# Patient Record
Sex: Female | Born: 1960 | Race: Black or African American | Hispanic: No | Smoking: Never smoker
Health system: Southern US, Community
[De-identification: ages and names within clinical notes are randomized; demographics above are authoritative.]

## PROBLEM LIST (undated history)

## (undated) DIAGNOSIS — E119 Type 2 diabetes mellitus without complications: Secondary | ICD-10-CM

## (undated) HISTORY — PX: BREAST BIOPSY: SHX20

## (undated) HISTORY — PX: TUBAL LIGATION: SHX77

## (undated) HISTORY — PX: COLONOSCOPY: SHX174

---

## 2003-06-02 HISTORY — PX: GASTRIC BYPASS: SHX52

## 2012-05-02 LAB — HM COLONOSCOPY

## 2017-08-30 ENCOUNTER — Ambulatory Visit: Payer: Self-pay | Admitting: Endocrinology, Diabetes and Metabolism

## 2017-11-10 ENCOUNTER — Encounter: Payer: Self-pay | Admitting: Endocrinology, Diabetes and Metabolism

## 2017-11-10 ENCOUNTER — Ambulatory Visit: Payer: Self-pay | Admitting: Endocrinology, Diabetes and Metabolism

## 2017-11-10 ENCOUNTER — Ambulatory Visit (INDEPENDENT_AMBULATORY_CARE_PROVIDER_SITE_OTHER): Payer: 59 | Admitting: Endocrinology, Diabetes and Metabolism

## 2017-11-10 VITALS — BP 114/83 | HR 98 | Ht 67.0 in | Wt 258.4 lb

## 2017-11-10 DIAGNOSIS — E1165 Type 2 diabetes mellitus with hyperglycemia: Secondary | ICD-10-CM

## 2017-11-10 LAB — POCT GLUCOSE: Whole Blood Glucose POCT: 176 mg/dL — AB (ref 70–100)

## 2017-11-10 LAB — POCT HEMOGLOBIN A1C: POCT Hgb A1C: 8.2 % — AB (ref 3.9–5.9)

## 2017-11-10 MED ORDER — FREESTYLE LITE TEST VI STRP
ORAL_STRIP | 9 refills | Status: AC
Start: 2017-11-10 — End: 2018-11-10

## 2017-11-10 MED ORDER — GLIMEPIRIDE 4 MG PO TABS
4.00 mg | ORAL_TABLET | Freq: Two times a day (BID) | ORAL | 3 refills | Status: DC
Start: 2017-11-10 — End: 2018-02-04

## 2017-11-10 NOTE — Progress Notes (Signed)
Subjective:      Kristin West is a 57 y.o. female who presents for an initial evaluation of Type 2 diabetes mellitus.  Current symptoms/problems include hyperglycemia and have been unchanged. Symptoms have been present for several months.    The patient was initially diagnosed with Type 2 diabetes mellitus based on the following criteria:  DM diagnosed many years ago (ealry [phone])    Known diabetic complications: none  Cardiovascular risk factors: diabetes mellitus  Current diabetic medications include metformin and glipizide.     Eye exam current (within one year): yes  Weight trend: stable  Prior visit with dietician: yes  Current diet: in general, a "healthy" diet    Current exercise: walking    Current monitoring regimen: home blood tests - 1 times daily  Home blood sugar records: fasting range: 120 - 130  Any episodes of hypoglycemia? no    Is She on ACE inhibitor or angiotensin II receptor blocker?   No     The following portions of the patient's history were reviewed and updated as appropriate: allergies, current medications, past family history, past medical history, past social history, past surgical history and problem list.    No past medical history on file.  No past surgical history on file.  No family history on file.  No Known Allergies  Social History     Social History   . Marital status: Married     Spouse name: N/A   . Number of children: N/A   . Years of education: N/A     Occupational History   . Not on file.     Social History Main Topics   . Smoking status: Never Smoker   . Smokeless tobacco: Never Used   . Alcohol use Not on file   . Drug use: Unknown   . Sexual activity: Not on file     Other Topics Concern   . Not on file     Social History Narrative   . No narrative on file     No current outpatient prescriptions on file prior to visit.     No current facility-administered medications on file prior to visit.      Vitals:    11/10/17 1248   BP: 114/83   Pulse: 98   SpO2: 98%     Lab  Results   Component Value Date    HGBA1C 8.2 (A) 11/10/2017     Lab Results   Component Value Date    HGBA1C 8.2 (A) 11/10/2017       Review of Systems    Constitutional: negative  Eyes: negative  Respiratory: negative  Cardiovascular: negative  Gastrointestinal: negative  Musculoskeletal: negative   Neurological: negative  Behavioral: negative  Skin: negative  Hematology: negative     Objective:      BP 114/83 (BP Site: Right arm, Patient Position: Sitting, Cuff Size: Large)   Pulse 98   Ht 1.702 m (5\' 7" )   Wt 117.2 kg (258 lb 6.4 oz)   SpO2 98%   BMI 40.47 kg/m      Constitutional: Oriented to person, place, and time and well-developed, well-nourished, and in no distress.   Head: Normocephalic and atraumatic.   Eyes: Conjunctivae and EOM are normal. Pupils are equal, round, and reactive to light.   Neck: Normal thyroid exam.  Cardiac: Normal auscultation.  Pulmonary: Normal auscultation.  Extremities: No cyanosis, clubbing or edema.  Musculoskeletal: Normal range of motion.   Neurological: Alert and oriented  to person, place, and time. Feet are in good condition with no ulcers or deformities and good response to light touch.  Psychiatric: Affect and judgment normal.    Lab Review  POC 172  POC A1C 8.2     Assessment:     Type 2 DM, uncontrolled     Plan:       Discussed with patient the importance of achieving and maintaining targeted glycemic control in order to reduce the risk of diabetes related complications such as eye disease, kidney disease, nerve damage, and amputation of the limbs.  Discussed the importance of patient's active involvement in the diabetes care (attending diabetes education classes or individual education sessions, monitoring blood glucose levels before and after meals and keeping a log of self monitored blood glucose, SMBG, taking prescribed medications on time, eating a healthy diabetic diet, and engaging in regular physical activity as tolerated).   Discussed the importance of  addressing and managing traditional modifiable cardiac risk factors (lipids, blood pressure, smoking, obesity, sedentary lifestyle) in order to reduce the risk of cardiovascular disease, CVD (heart attacks, heart failure, strokes, and peripheral arterial disease).   Discussed the importance of recognizing, treating, and preventing hypoglycemia.  Discussed the importance of having regular eye exams and foot care.    1.  Rx changes: Get baseline labs. We discussed options such as glp-1 agonists, SGLT-2 inhibitors, etc..  2.  Education: Reviewed 'ABCs' of diabetes management (respective goals in parentheses):  A1C (<7), blood pressure (<130/80), and cholesterol (LDL <100). Follow up on BP with PCP.  3.  Compliance at present is estimated to be good.   4.  Follow up: 3 months.    Elijio Miles.H.Tamera Punt, MD  [facility]  [address]  [place], [place] [address]  Tel: [phone]  Fax: [phone]    [address]  [place], [place] [address]  Tel: [phone]              Fax: [phone]

## 2017-12-10 ENCOUNTER — Encounter: Payer: Self-pay | Admitting: Endocrinology, Diabetes and Metabolism

## 2017-12-10 DIAGNOSIS — E1165 Type 2 diabetes mellitus with hyperglycemia: Secondary | ICD-10-CM | POA: Insufficient documentation

## 2018-01-24 ENCOUNTER — Other Ambulatory Visit: Payer: Self-pay | Admitting: Endocrinology, Diabetes and Metabolism

## 2018-01-24 MED ORDER — METFORMIN HCL ER (MOD) 1000 MG PO TB24
1000.00 mg | ORAL_TABLET | Freq: Two times a day (BID) | ORAL | 3 refills | Status: DC
Start: 2018-01-24 — End: 2018-08-28

## 2018-01-27 NOTE — Progress Notes (Unsigned)
On 01-24-18 , refilled metformin. GH

## 2018-02-04 ENCOUNTER — Other Ambulatory Visit: Payer: Self-pay | Admitting: Endocrinology, Diabetes and Metabolism

## 2018-02-04 ENCOUNTER — Other Ambulatory Visit: Payer: Self-pay

## 2018-02-04 MED ORDER — GLIPIZIDE 10 MG PO TABS
10.00 mg | ORAL_TABLET | Freq: Two times a day (BID) | ORAL | 3 refills | Status: DC
Start: 2018-02-04 — End: 2019-01-12

## 2018-03-16 ENCOUNTER — Ambulatory Visit: Payer: 59 | Admitting: Endocrinology, Diabetes and Metabolism

## 2018-04-18 ENCOUNTER — Ambulatory Visit: Payer: 59 | Admitting: Endocrinology, Diabetes and Metabolism

## 2018-05-24 ENCOUNTER — Telehealth: Payer: Self-pay | Admitting: Endocrinology, Diabetes and Metabolism

## 2018-05-24 NOTE — Telephone Encounter (Signed)
-----   Message from Mackie Pai sent at 05/24/2018  8:10 AM EST -----  Regarding: Lab Orders  Good Morning Jill Alexanders,  Patient Thalia Baldonado called and reschedule her appt from 05/26/18 to 07/21/18.  She has lost her lab orders and would like you to resend them to her.  She is requesting a call from you today if possible.  She can be reached at [phone].    Thanks,  Elease Hashimoto

## 2018-05-24 NOTE — Telephone Encounter (Signed)
Mailed lab orders to patient's home address on file.

## 2018-05-26 ENCOUNTER — Ambulatory Visit: Payer: 59 | Admitting: Endocrinology, Diabetes and Metabolism

## 2018-06-22 ENCOUNTER — Telehealth: Payer: Self-pay | Admitting: Endocrinology, Diabetes and Metabolism

## 2018-06-22 ENCOUNTER — Other Ambulatory Visit (HOSPITAL_BASED_OUTPATIENT_CLINIC_OR_DEPARTMENT_OTHER): Payer: Self-pay | Admitting: Endocrinology, Diabetes and Metabolism

## 2018-06-22 DIAGNOSIS — E1165 Type 2 diabetes mellitus with hyperglycemia: Secondary | ICD-10-CM

## 2018-06-22 NOTE — Telephone Encounter (Signed)
Mailed lab orders to patient's home address on file. Notified her to fast before lab work. Patient verbalized understanding.

## 2018-06-22 NOTE — Telephone Encounter (Signed)
-----   Message from Jaclyn Shaggy, MD sent at 06/22/2018  2:44 PM EST -----  Regarding: RE: Lab order  We can mail out  ----- Message -----  From: Randa Ngo II  Sent: 06/22/2018   2:28 PM EST  To: Jaclyn Shaggy, MD  Subject: Lab order                                        Dr. Tamera Punt,    Patient has a follow up visit on 07/21/18. Patient would like to get lab work done before her appointment. I can mail if appropriate.     Thank you.   ----- Message -----  From: Marta Lamas  Sent: 06/22/2018   1:05 PM EST  To: Randa Ngo II  Subject: lab order                                        Pt says she is waiting for lab order. She said if you can please mail this to her, she wants to get it done before her apt. She said she has not received it since her last request. Please give her a call when this is done

## 2018-07-09 LAB — LIPID PANEL
Cholesterol / HDL Ratio: 1.7 ratio (ref 0.0–4.4)
Cholesterol: 176 mg/dL (ref 100–199)
HDL: 106 mg/dL (ref >39)
LDL Calculated: 50 mg/dL (ref 0–99)
Triglycerides: 99 mg/dL (ref 0–149)
VLDL Calculated: 20 mg/dL (ref 5–40)

## 2018-07-09 LAB — COMPREHENSIVE METABOLIC PANEL
ALT: 19 IU/L (ref 0–32)
AST (SGOT): 22 IU/L (ref 0–40)
Albumin/Globulin Ratio: 1.4 (ref 1.2–2.2)
Albumin: 4.3 g/dL (ref 3.8–4.9)
Alkaline Phosphatase: 92 IU/L (ref 39–117)
BUN / Creatinine Ratio: 21 (ref 9–23)
BUN: 17 mg/dL (ref 6–24)
Bilirubin, Total: 0.6 mg/dL (ref 0.0–1.2)
CO2: 20 mmol/L (ref 20–29)
Calcium: 9.7 mg/dL (ref 8.7–10.2)
Chloride: 98 mmol/L (ref 96–106)
Creatinine: 0.82 mg/dL (ref 0.57–1.00)
EGFR: 79 mL/min/{1.73_m2} (ref >59)
EGFR: 91 mL/min/{1.73_m2} (ref >59)
Globulin, Total: 3.1 g/dL (ref 1.5–4.5)
Glucose: 213 mg/dL — ABNORMAL HIGH (ref 65–99)
Potassium: 4.5 mmol/L (ref 3.5–5.2)
Protein, Total: 7.4 g/dL (ref 6.0–8.5)
Sodium: 136 mmol/L (ref 134–144)

## 2018-07-09 LAB — MICROALBUMIN, RANDOM URINE
Creatinine, UR: 14.8 mg/dL
Microalb/Crt. Ratio: <20 mg/g creat (ref 0–29)
Microalbumin, UR: <3 ug/mL

## 2018-07-21 ENCOUNTER — Ambulatory Visit (INDEPENDENT_AMBULATORY_CARE_PROVIDER_SITE_OTHER): Payer: 59 | Admitting: Endocrinology, Diabetes and Metabolism

## 2018-07-21 ENCOUNTER — Encounter: Payer: Self-pay | Admitting: Endocrinology, Diabetes and Metabolism

## 2018-07-21 VITALS — BP 132/83 | HR 90

## 2018-07-21 DIAGNOSIS — E1165 Type 2 diabetes mellitus with hyperglycemia: Secondary | ICD-10-CM

## 2018-07-21 MED ORDER — FREESTYLE FREEDOM LITE W/DEVICE KIT
PACK | 1 refills | Status: AC
Start: 2018-07-21 — End: 2019-07-21

## 2018-07-21 MED ORDER — DULAGLUTIDE 0.75 MG/0.5ML SC SOPN
0.75 mg | PEN_INJECTOR | SUBCUTANEOUS | 0 refills | Status: DC
Start: 2018-07-21 — End: 2018-08-15

## 2018-07-21 NOTE — Progress Notes (Signed)
Subjective:      Kristin West is a 58 y.o. female who presents for an initial evaluation of Type 2 diabetes mellitus.  Her last visit was back in June 2019. We reviewed her recent labs.    Current symptoms/problems include hyperglycemia and have been unchanged. Symptoms have been present for several months.    The patient was initially diagnosed with Type 2 diabetes mellitus based on the following criteria:  DM diagnosed many years ago (ealry [phone]). Tried Victoza and Byetta. The insurance then stopped paying for them    Known diabetic complications: none  Cardiovascular risk factors: diabetes mellitus  Current diabetic medications include metformin and glipizide.     Eye exam current (within one year): yes  Weight trend: stable  Prior visit with dietician: yes  Current diet: in general, a "healthy" diet    Current exercise: walking    Current monitoring regimen: home blood tests - 1 times daily  Home blood sugar records: mid 100 mg/dL  Any episodes of hypoglycemia? no    Is She on ACE inhibitor or angiotensin II receptor blocker?   No     The following portions of the patient's history were reviewed and updated as appropriate: allergies, current medications, past family history, past medical history, past social history, past surgical history and problem list.    No past medical history on file.  No past surgical history on file.  No family history on file.  No Known Allergies  Social History     Socioeconomic History   . Marital status: Married     Spouse name: Not on file   . Number of children: Not on file   . Years of education: Not on file   . Highest education level: Not on file   Occupational History   . Not on file   Social Needs   . Financial resource strain: Not on file   . Food insecurity:     Worry: Not on file     Inability: Not on file   . Transportation needs:     Medical: Not on file     Non-medical: Not on file   Tobacco Use   . Smoking status: Never Smoker   . Smokeless tobacco: Never Used    Substance and Sexual Activity   . Alcohol use: Not on file   . Drug use: Not on file   . Sexual activity: Not on file   Lifestyle   . Physical activity:     Days per week: Not on file     Minutes per session: Not on file   . Stress: Not on file   Relationships   . Social connections:     Talks on phone: Not on file     Gets together: Not on file     Attends religious service: Not on file     Active member of club or organization: Not on file     Attends meetings of clubs or organizations: Not on file     Relationship status: Not on file   . Intimate partner violence:     Fear of current or ex partner: Not on file     Emotionally abused: Not on file     Physically abused: Not on file     Forced sexual activity: Not on file   Other Topics Concern   . Not on file   Social History Narrative   . Not on file     Current Outpatient  Medications on File Prior to Visit   Medication Sig Dispense Refill   . diclofenac (VOLTAREN) 75 MG EC tablet Take 75 mg by mouth daily  0   . FREESTYLE LITE test strip Use twice daily 100 each 9   . glipiZIDE (GLUCOTROL) 10 MG tablet Take 1 tablet (10 mg total) by mouth 2 (two) times daily before meals 180 tablet 3   . metFORMIN (GLUMETZA) 1000 MG (MOD) 24 hr tablet Take 1 tablet (1,000 mg total) by mouth 2 (two) times daily 180 tablet 3     No current facility-administered medications on file prior to visit.      Vitals:    07/21/18 1308   BP: 132/83   Pulse: 90   SpO2: 98%     Lab Results   Component Value Date    HGBA1C 8.2 (A) 11/10/2017     Lab Results   Component Value Date    CHOL 176 07/08/2018    TRIG 99 07/08/2018    HDL 106 07/08/2018    LDL 50 07/08/2018    ALT 19 07/08/2018    AST 22 07/08/2018    NA 136 07/08/2018    K 4.5 07/08/2018    CL 98 07/08/2018    CREAT 0.82 07/08/2018    BUN 17 07/08/2018    CO2 20 07/08/2018    GLU 213 (H) 07/08/2018    HGBA1C 8.2 (A) 11/10/2017       Review of Systems    Constitutional: negative  Eyes: negative  Respiratory: negative  Cardiovascular:  negative  Gastrointestinal: negative  Musculoskeletal: negative   Neurological: negative  Behavioral: negative  Skin: negative  Hematology: negative     Objective:      BP 132/83   Pulse 90   SpO2 98%      Constitutional: Oriented to person, place, and time and well-developed, well-nourished, and in no distress.   Head: Normocephalic and atraumatic.   Eyes: Conjunctivae and EOM are normal. Pupils are equal, round, and reactive to light.   Neurological: Alert and oriented to person, place, and time.  Psychiatric: Affect and judgment normal.    Lab Review  POC 249  POC A1C 8.6     Assessment:     Type 2 DM, uncontrolled     Plan:       1.  Rx changes: Will start Trulicty 0.75 mg weekly and increase to 1.5 mg weekly after that if well tolerated. We discussed the side effects with te glp-1 agonists and I showed her how to do the trulicity injection.  2.  Education: Reviewed 'ABCs' of diabetes management (respective goals in parentheses):  A1C (<7), blood pressure (<130/80), and cholesterol (LDL <100). Follow up on BP with PCP.  3.  Compliance at present is estimated to be fair.  4.  Follow up: 3 months.    Elijio Miles.H.Tamera Punt, MD  [facility]  [address]  [place], [place] [address]  Tel: [phone]  Fax: [phone]    [address]  [place], [place] [address]  Tel: [phone]              Fax: [phone]

## 2018-07-29 ENCOUNTER — Other Ambulatory Visit (HOSPITAL_BASED_OUTPATIENT_CLINIC_OR_DEPARTMENT_OTHER): Payer: Self-pay | Admitting: Endocrinology, Diabetes and Metabolism

## 2018-07-29 DIAGNOSIS — E1165 Type 2 diabetes mellitus with hyperglycemia: Secondary | ICD-10-CM

## 2018-07-29 MED ORDER — ACCU-CHEK AVIVA PLUS W/DEVICE KIT
1.00 | PACK | Freq: Every day | 0 refills | Status: AC
Start: 2018-07-29 — End: 2019-07-29

## 2018-07-29 MED ORDER — ACCU-CHEK AVIVA VI STRP
ORAL_STRIP | 5 refills | Status: DC
Start: 2018-07-29 — End: 2018-08-03

## 2018-08-03 ENCOUNTER — Other Ambulatory Visit (HOSPITAL_BASED_OUTPATIENT_CLINIC_OR_DEPARTMENT_OTHER): Payer: Self-pay | Admitting: Endocrinology, Diabetes and Metabolism

## 2018-08-03 MED ORDER — GLUCOSE BLOOD VI STRP
ORAL_STRIP | 5 refills | Status: AC
Start: 2018-08-03 — End: ?

## 2018-08-04 ENCOUNTER — Telehealth: Payer: Self-pay | Admitting: Endocrinology, Diabetes and Metabolism

## 2018-08-04 NOTE — Telephone Encounter (Signed)
Rx sent to pharmacy on file. Rx receipt confirmed by pharmacy.

## 2018-08-04 NOTE — Telephone Encounter (Signed)
-----   Message from Jaclyn Shaggy, MD sent at 08/03/2018  4:15 PM EST -----  Regarding: RE: Rx request  Done  ----- Message -----  From: Randa Ngo II  Sent: 08/03/2018   3:54 PM EST  To: Jaclyn Shaggy, MD  Subject: Rx request                                       Dr. Tamera Punt,    [facility] is requesting Accu-chek Guide test strips for a 90 day supply.     Thank you.

## 2018-08-15 ENCOUNTER — Other Ambulatory Visit: Payer: Self-pay | Admitting: Endocrinology, Diabetes and Metabolism

## 2018-08-15 MED ORDER — DULAGLUTIDE 1.5 MG/0.5ML SC SOPN
1.50 mg | PEN_INJECTOR | SUBCUTANEOUS | 5 refills | Status: DC
Start: 2018-08-15 — End: 2019-07-24

## 2018-08-16 ENCOUNTER — Telehealth: Payer: Self-pay

## 2018-08-16 NOTE — Telephone Encounter (Addendum)
Kristin West was notified that Dr. Tamera Punt sent a new rx for Trulicity 1.5 mg pen to [facility]. The administration is the same. Patient asked to contact the office should she have any problems with the higher dose of Trulicity. [name] verbalized her understanding. 2:11 pm..        ----- Message from Jaclyn Shaggy, MD sent at 08/15/2018  3:51 PM EDT -----  Regarding: RE: Trulicity rx  Contact: [phone]  I changed her dose to 1.5 mg weekly. She can start with it and let us know if she has side effects   ----- Message -----  From: Pecola Lawless, LPN  Sent: 0/98/1191  12:00 PM EDT  To: Jaclyn Shaggy, MD  Subject: Trulicity rx                                     [name] left a message stating that she took (administered) her last Trulicity injection today. Next step? She didn't indicate any issues with the lower dose. I didn't see a rx for the 1.5 mg dose.

## 2018-08-27 ENCOUNTER — Other Ambulatory Visit: Payer: Self-pay | Admitting: Endocrinology, Diabetes and Metabolism

## 2018-09-07 DIAGNOSIS — R079 Chest pain, unspecified: Secondary | ICD-10-CM | POA: Insufficient documentation

## 2018-10-20 ENCOUNTER — Encounter: Payer: Self-pay | Admitting: Endocrinology, Diabetes and Metabolism

## 2018-10-20 ENCOUNTER — Telehealth (INDEPENDENT_AMBULATORY_CARE_PROVIDER_SITE_OTHER): Payer: 59 | Admitting: Endocrinology, Diabetes and Metabolism

## 2018-10-20 ENCOUNTER — Telehealth: Payer: Self-pay | Admitting: Endocrinology, Diabetes and Metabolism

## 2018-10-20 DIAGNOSIS — E559 Vitamin D deficiency, unspecified: Secondary | ICD-10-CM

## 2018-10-20 DIAGNOSIS — E1165 Type 2 diabetes mellitus with hyperglycemia: Secondary | ICD-10-CM

## 2018-10-20 NOTE — Telephone Encounter (Signed)
-----   Message from Marta Lamas sent at 10/20/2018 11:19 AM EDT -----  Regarding: lab order  Pt states she never received lab order

## 2018-10-20 NOTE — Progress Notes (Signed)
[facility]   EndocrinologyTelemedicine Follow-up  via HIPAA compliant secure video link   Date 10/20/2018 (Start time 11 am - End Time 11:20 am)    Originating site (Patient location): Home   Distant site (Provider location): [facility]  Provider and Title:  Vela Prose, MD  Consent obtained: Yes (verbal)   Language: Kristin West is a 58 y.o. female who presents for an initial evaluation of Type 2 diabetes mellitus.  Her last visit was back in February 2020. Also has a H/O vitamin D deficiency.     Current symptoms/problems include hyperglycemia and have been unchanged. Symptoms have been present for several months.    The patient was initially diagnosed with Type 2 diabetes mellitus based on the following criteria:  DM diagnosed many years ago (ealry [phone]). Tried Victoza and Byetta. The insurance then stopped paying for them. On Trulicity now.    Known diabetic complications: none  Cardiovascular risk factors: diabetes mellitus  Current diabetic medications include trulicity, metformin and glipizide     Eye exam current (within one year): yes  Weight trend: stable  Prior visit with dietician: yes  Current diet: in general, a "healthy" diet    Current exercise: walking    Current monitoring regimen: home blood tests - 1 times daily  Home blood sugar records: low to mid 100 mg/dL overall   Any episodes of hypoglycemia? no    Is She on ACE inhibitor or angiotensin II receptor blocker?   No     The following portions of the patient's history were reviewed and updated as appropriate: allergies, current medications, past family history, past medical history, past social history, past surgical history and problem list.    No past medical history on file.  No past surgical history on file.  No family history on file.  No Known Allergies  Social History     Socioeconomic History   . Marital status: Married     Spouse name: Not on  file   . Number of children: Not on file   . Years of education: Not on file   . Highest education level: Not on file   Occupational History   . Not on file   Social Needs   . Financial resource strain: Not on file   . Food insecurity:     Worry: Not on file     Inability: Not on file   . Transportation needs:     Medical: Not on file     Non-medical: Not on file   Tobacco Use   . Smoking status: Never Smoker   . Smokeless tobacco: Never Used   Substance and Sexual Activity   . Alcohol use: Not on file   . Drug use: Not on file   . Sexual activity: Not on file   Lifestyle   . Physical activity:     Days per week: Not on file     Minutes per session: Not on file   . Stress: Not on file   Relationships   . Social connections:     Talks on phone: Not on file     Gets together: Not on file     Attends religious service: Not on file     Active member of club or organization: Not on file     Attends meetings of clubs or organizations: Not on file     Relationship  status: Not on file   . Intimate partner violence:     Fear of current or ex partner: Not on file     Emotionally abused: Not on file     Physically abused: Not on file     Forced sexual activity: Not on file   Other Topics Concern   . Not on file   Social History Narrative   . Not on file     Current Outpatient Medications on File Prior to Visit   Medication Sig Dispense Refill   . Blood Glucose Monitoring Suppl (ACCU-CHEK AVIVA PLUS) w/Device Kit 1 Device by Does not apply route daily 1 kit 0   . Blood Glucose Monitoring Suppl (FREESTYLE FREEDOM LITE) w/Device Kit Use daily 1 each 1   . diclofenac (VOLTAREN) 75 MG EC tablet Take 75 mg by mouth daily  0   . Dulaglutide 1.5 MG/0.5ML Solution Pen-injector Inject 1.5 mg into the skin once a week 4 pen 5   . FREESTYLE LITE test strip Use twice daily 100 each 9   . glipiZIDE (GLUCOTROL) 10 MG tablet Take 1 tablet (10 mg total) by mouth 2 (two) times daily before meals 180 tablet 3   . glucose blood (ACCU-CHEK GUIDE)  test strip Use twice daily 100 each 5   . metFORMIN (GLUMETZA) 1000 MG (MOD) 24 hr tablet TAKE 1 TABLET BY MOUTH TWICE A DAY 60 tablet 5     No current facility-administered medications on file prior to visit.      There were no vitals filed for this visit.  Lab Results   Component Value Date    HGBA1C 8.2 (A) 11/10/2017     Lab Results   Component Value Date    CHOL 176 07/08/2018    TRIG 99 07/08/2018    HDL 106 07/08/2018    LDL 50 07/08/2018    ALT 19 07/08/2018    AST 22 07/08/2018    NA 136 07/08/2018    K 4.5 07/08/2018    CL 98 07/08/2018    CREAT 0.82 07/08/2018    BUN 17 07/08/2018    CO2 20 07/08/2018    GLU 213 (H) 07/08/2018    HGBA1C 8.2 (A) 11/10/2017       Review of Systems    Constitutional: negative  Eyes: negative  Respiratory: negative  Cardiovascular: negative  Gastrointestinal: negative  Musculoskeletal: negative   Neurological: negative  Behavioral: negative  Skin: negative  Hematology: negative     Objective:      There were no vitals taken for this visit.     Constitutional: Oriented to person, place, and time and well-developed, well-nourished, and in no distress.   Head: Normocephalic and atraumatic.   Eyes: Conjunctivae and EOM are normal.      Assessment:     Type 2 DM  Vitamin D deficiency      Plan:       1.  Rx changes: Continue with current pharmacotherapy for now with emphasis on prevention of hypoglycemia.    2.  Get labs (A1C, lipids, Vita D, CMP, TSH and urine microalbumin).  3.  Education: Reviewed 'ABCs' of diabetes management (respective goals in parentheses):  A1C (<7), blood pressure (<130/80), and cholesterol (LDL <100). Follow up on BP with PCP.  4.  Compliance at present is estimated to be fair.  5.  Follow up: 3 months.    Elijio Miles.H.Tamera Punt, MD  [facility]  [address]  [place], [place] [address]  Tel: [phone]  Fax: [phone]    [address]  [place], [place] [address]  Tel: [phone]              Fax:  [phone]

## 2018-10-20 NOTE — Telephone Encounter (Signed)
Faxed lab orders to [phone]. Fax confirmation received.

## 2018-11-07 ENCOUNTER — Other Ambulatory Visit (HOSPITAL_BASED_OUTPATIENT_CLINIC_OR_DEPARTMENT_OTHER): Payer: Self-pay | Admitting: Endocrinology, Diabetes and Metabolism

## 2018-11-07 LAB — LIPID PANEL
Cholesterol: 153 (ref 0–200)
HDL: 102 — AB (ref 35–70)
LDL Cholesterol: 34
Triglycerides: 86 (ref 40–160)

## 2018-11-07 LAB — MICROALBUMIN, URINE: Microalb, Ur: 11.6

## 2018-11-07 LAB — TSH: TSH: 2.13 (ref 0.41–5.90)

## 2018-11-08 LAB — COMPREHENSIVE METABOLIC PANEL
ALT: 17 IU/L (ref 0–32)
AST (SGOT): 25 IU/L (ref 0–40)
Albumin/Globulin Ratio: 1.2 (ref 1.2–2.2)
Albumin: 3.8 g/dL (ref 3.8–4.9)
Alkaline Phosphatase: 84 IU/L (ref 39–117)
BUN / Creatinine Ratio: 15 (ref 9–23)
BUN: 13 mg/dL (ref 6–24)
Bilirubin, Total: 0.5 mg/dL (ref 0.0–1.2)
CO2: 22 mmol/L (ref 20–29)
Calcium: 9.4 mg/dL (ref 8.7–10.2)
Chloride: 99 mmol/L (ref 96–106)
Creatinine: 0.87 mg/dL (ref 0.57–1.00)
EGFR: 74 mL/min/{1.73_m2} (ref >59)
EGFR: 85 mL/min/{1.73_m2} (ref >59)
Globulin, Total: 3.2 g/dL (ref 1.5–4.5)
Glucose: 195 mg/dL — ABNORMAL HIGH (ref 65–99)
Potassium: 4.3 mmol/L (ref 3.5–5.2)
Protein, Total: 7 g/dL (ref 6.0–8.5)
Sodium: 134 mmol/L (ref 134–144)

## 2018-11-08 LAB — LIPID PANEL, WITHOUT TOTAL CHOLESTEROL/HDL RATIO, SERUM
Cholesterol: 153 mg/dL (ref 100–199)
HDL: 102 mg/dL (ref >39)
LDL Calculated: 34 mg/dL (ref 0–99)
Triglycerides: 86 mg/dL (ref 0–149)
VLDL Calculated: 17 mg/dL (ref 5–40)

## 2018-11-08 LAB — VITAMIN D,25 OH,TOTAL: Vitamin D 25-Hydroxy: 15.5 ng/mL — ABNORMAL LOW (ref 30.0–100.0)

## 2018-11-08 LAB — MICROALBUMIN, RANDOM URINE
Creatinine, UR: 136.2 mg/dL
Microalb/Crt. Ratio: 9 mg/g creat (ref 0–29)
Microalbumin, UR: 11.6 ug/mL

## 2018-11-08 LAB — HEMOGLOBIN A1C: Hemoglobin A1C: 7.3 % — ABNORMAL HIGH (ref 4.8–5.6)

## 2018-11-08 LAB — TSH: TSH: 2.13 u[IU]/mL (ref 0.450–4.500)

## 2018-11-28 ENCOUNTER — Telehealth: Payer: Self-pay | Admitting: Endocrinology, Diabetes and Metabolism

## 2018-11-28 NOTE — Telephone Encounter (Signed)
Spoke to patient, updated her on lab results and mailed out a copy of lab results to patient's new address on file.     [address] [place], [place] [address].

## 2018-11-28 NOTE — Telephone Encounter (Signed)
-----   Message from Jaclyn Shaggy, MD sent at 11/28/2018  1:27 PM EDT -----  Regarding: RE: Lab results  Contact: [phone]  Ure,  Her A1C has gotten significantly better. Everything else is good except the vitamin D is low at 15.  She needs to take 2000 units daily (over the counter)  Thanks  ----- Message -----  From: Randa Ngo II  Sent: 11/28/2018  11:21 AM EDT  To: Jaclyn Shaggy, MD  Subject: Lab results                                      Dr. Tamera Punt,    Patient is calling for an update to her lab results from 11/07/18. Please let me know and I can update the patient.     Thank you.   ----- Message -----  From: Pablo Ledger, LPN  Sent: 0/98/1191  11:16 AM EDT  To: Randa Ngo II  Subject: Lab Results                                      Jill Alexanders,    Patient is calling to inquire about lab results from 11/05/2018. He has not heard back. Please call with results at [phone]. Thanks!

## 2019-01-12 ENCOUNTER — Other Ambulatory Visit: Payer: Self-pay | Admitting: Endocrinology, Diabetes and Metabolism

## 2019-02-08 DIAGNOSIS — E559 Vitamin D deficiency, unspecified: Secondary | ICD-10-CM | POA: Insufficient documentation

## 2019-04-03 ENCOUNTER — Encounter: Payer: Self-pay | Admitting: Endocrinology, Diabetes and Metabolism

## 2019-04-03 ENCOUNTER — Other Ambulatory Visit: Payer: Self-pay | Admitting: Endocrinology, Diabetes and Metabolism

## 2019-04-03 DIAGNOSIS — E559 Vitamin D deficiency, unspecified: Secondary | ICD-10-CM

## 2019-04-03 DIAGNOSIS — E1165 Type 2 diabetes mellitus with hyperglycemia: Secondary | ICD-10-CM

## 2019-04-03 NOTE — Progress Notes (Signed)
Mailed printed lab requisition (cmp, a1c, vit d 25 oh total) to patient address on file per request. 3:47 pm.

## 2019-05-22 ENCOUNTER — Other Ambulatory Visit: Payer: Self-pay | Admitting: Nurse Practitioner

## 2019-05-22 DIAGNOSIS — Z1231 Encounter for screening mammogram for malignant neoplasm of breast: Secondary | ICD-10-CM

## 2019-05-23 ENCOUNTER — Other Ambulatory Visit: Payer: Self-pay

## 2019-05-23 ENCOUNTER — Ambulatory Visit
Admission: RE | Admit: 2019-05-23 | Discharge: 2019-05-23 | Disposition: A | Discharge status: Home / Self Care | Payer: No Typology Code available for payment source | Source: Ambulatory Visit | Attending: Nurse Practitioner | Admitting: Nurse Practitioner

## 2019-05-23 DIAGNOSIS — Z1231 Encounter for screening mammogram for malignant neoplasm of breast: Secondary | ICD-10-CM

## 2019-05-23 LAB — BASIC METABOLIC PANEL
BUN: 18 (ref 4–21)
CO2: 21 (ref 13–22)
Chloride: 99 (ref 99–108)
Creatinine: 0.9 (ref 0.5–1.1)
Glucose: 154
Potassium: 4.4 (ref 3.4–5.3)
Sodium: 138 (ref 137–147)

## 2019-05-23 LAB — HEPATIC FUNCTION PANEL
ALT: 85 — AB (ref 7–35)
AST: 115 — AB (ref 13–35)
Alkaline Phosphatase: 85 (ref 25–125)
Bilirubin, Total: 0.4

## 2019-05-23 LAB — COMPREHENSIVE METABOLIC PANEL
Albumin: 4.1 (ref 3.5–5.0)
Calcium: 9.7 (ref 8.7–10.7)
GFR calc Af Amer: 86
GFR calc non Af Amer: 74
Globulin: 2.8

## 2019-05-23 LAB — VITAMIN D 25 HYDROXY (VIT D DEFICIENCY, FRACTURES): Vit D, 25-Hydroxy: 16.3

## 2019-05-23 LAB — HEMOGLOBIN A1C: Hemoglobin A1C: 7.7

## 2019-05-24 LAB — COMPREHENSIVE METABOLIC PANEL
ALT: 85 IU/L — ABNORMAL HIGH (ref 0–32)
AST (SGOT): 115 IU/L — ABNORMAL HIGH (ref 0–40)
Albumin/Globulin Ratio: 1.5 (ref 1.2–2.2)
Albumin: 4.1 g/dL (ref 3.8–4.9)
Alkaline Phosphatase: 85 IU/L (ref 39–117)
BUN / Creatinine Ratio: 21 (ref 9–23)
BUN: 18 mg/dL (ref 6–24)
Bilirubin, Total: 0.4 mg/dL (ref 0.0–1.2)
CO2: 21 mmol/L (ref 20–29)
Calcium: 9.7 mg/dL (ref 8.7–10.2)
Chloride: 99 mmol/L (ref 96–106)
Creatinine: 0.86 mg/dL (ref 0.57–1.00)
EGFR: 74 mL/min/{1.73_m2} (ref >59)
EGFR: 86 mL/min/{1.73_m2} (ref >59)
Globulin, Total: 2.8 g/dL (ref 1.5–4.5)
Glucose: 154 mg/dL — ABNORMAL HIGH (ref 65–99)
Potassium: 4.4 mmol/L (ref 3.5–5.2)
Protein, Total: 6.9 g/dL (ref 6.0–8.5)
Sodium: 138 mmol/L (ref 134–144)

## 2019-05-24 LAB — HEMOGLOBIN A1C: Hemoglobin A1C: 7.7 % — ABNORMAL HIGH (ref 4.8–5.6)

## 2019-05-24 LAB — VITAMIN D,25 OH,TOTAL: Vitamin D 25-Hydroxy: 16.3 ng/mL — ABNORMAL LOW (ref 30.0–100.0)

## 2019-05-25 ENCOUNTER — Other Ambulatory Visit: Payer: Self-pay

## 2019-05-27 MED ORDER — METFORMIN HCL ER (MOD) 1000 MG PO TB24
1000.00 mg | ORAL_TABLET | Freq: Two times a day (BID) | ORAL | 5 refills | Status: AC
Start: 2019-05-27 — End: ?

## 2019-06-01 ENCOUNTER — Ambulatory Visit
Admission: EM | Admit: 2019-06-01 | Discharge: 2019-06-01 | Disposition: A | Discharge status: Home / Self Care | Payer: No Typology Code available for payment source

## 2019-06-01 ENCOUNTER — Other Ambulatory Visit: Payer: Self-pay

## 2019-06-01 DIAGNOSIS — G5601 Carpal tunnel syndrome, right upper limb: Secondary | ICD-10-CM

## 2019-06-01 HISTORY — DX: Type 2 diabetes mellitus without complications: E11.9

## 2019-06-01 MED ORDER — MELOXICAM 7.5 MG PO TABS: 7.5000 mg | ORAL_TABLET | Freq: Every day | ORAL | 0 refills | Status: DC

## 2019-06-01 NOTE — ED Triage Notes (Signed)
Pt c/o bilateral hand numbness for past 2-3 months. States in the past few weeks she has developed pain to both hands. Pt c/o severe pain and numbness to rt hand and unable to grip or use.

## 2019-06-01 NOTE — Discharge Instructions (Signed)
Start Mobic. Do not take ibuprofen (motrin/advil)/ naproxen (aleve) while on mobic. Ice compress, wrist splint during activity/sleep. Follow up with orthopedics if symptoms not improving. Discuss with PCP to make sure we do not have to worry about diabetic neuropathy.

## 2019-06-01 NOTE — ED Provider Notes (Signed)
[name]    CSN: [id number] Arrival date & time: 06/01/19  0827      History   Chief Complaint Chief Complaint  Patient presents with  . Hand Pain    HPI Frances Rodriguez is a 58 y.o. female.   58 year old female wit history of DM comes in for 2 to 21-month history of bilateral hand numbness.  Denies injury/trauma.  States for the past few weeks, has started having mild pain to bilateral hands.  However, woke up this morning with severe pain and to the right hand, unable to to make a fist, and therefore came in for evaluation.  Denies injury/trauma.  Pain and numbness to the right hand located to the first and third fingers.  No obvious wrist pain.  No erythema, warmth.  Denies any neck, shoulder, elbow pain.  Denies history of neck injury.  Denies chest pain, shortness of breath.  Denies history of diabetic neuropathy.  Tried Tylenol without relief.  Took 1 oxycodone without relief. Last a1c 7.7. Has PCP appointment next week.      Past Medical History:  Diagnosis Date  . Diabetes mellitus without complication (HCC)     There are no problems to display for this patient.   Past Surgical History:  Procedure Laterality Date  . BREAST BIOPSY Left    2013 benign     OB History   No obstetric history on file.      Home Medications    Prior to Admission medications   Medication Sig Start Date End Date Taking? Authorizing Provider  Dulaglutide 1.5 MG/0.5ML SOPN Inject 1.5 mg into the skin once a week.    Yes [provider]  glipiZIDE (GLUCOTROL) 10 MG tablet Take 10 mg by mouth 2 (two) times daily before a meal.   Yes [provider]  metFORMIN (GLUCOPHAGE) 1000 MG tablet Take 1,000 mg by mouth 2 (two) times daily with a meal.   Yes [provider]  meloxicam (MOBIC) 7.5 MG tablet Take 1 tablet (7.5 mg total) by mouth daily. 06/01/19   Belinda Fisher, PA-C    Family History Family History  Problem Relation Age of Onset  . Breast  cancer Mother 35  . Breast cancer Sister        over 70    Social History Social History   Tobacco Use  . Smoking status: Never Smoker  . Smokeless tobacco: Never Used  Substance Use Topics  . Alcohol use: Yes  . Drug use: Not on file     Allergies   Patient has no known allergies.   Review of Systems Review of Systems  Reason unable to perform ROS: See HPI as above.     Physical Exam Triage Vital Signs ED Triage Vitals [06/01/19 0837]  Enc Vitals Group     BP (!) 171/94     Pulse Rate (!) 107     Resp 18     Temp 98.2 F (36.8 C)     Temp Source Oral     SpO2 96 %     Weight      Height      Head Circumference      Peak Flow      Pain Score 6     Pain Loc      Pain Edu?      Excl. in GC?    No data found.  Updated Vital Signs BP (!) 171/94 (BP Location: Left Arm)  Pulse (!) 107   Temp 98.2 F (36.8 C) (Oral)   Resp 18   SpO2 96%   Physical Exam Constitutional:      General: She is not in acute distress.    Appearance: Normal appearance. She is not ill-appearing, toxic-appearing or diaphoretic.  HENT:     Head: Normocephalic and atraumatic.     Mouth/Throat:     Mouth: Mucous membranes are moist.     Pharynx: Oropharynx is clear. Uvula midline.  Neck:     Comments: No tenderness to palpation of the neck. Cardiovascular:     Rate and Rhythm: Normal rate and regular rhythm.     Heart sounds: Normal heart sounds. No murmur. No friction rub. No gallop.   Pulmonary:     Effort: Pulmonary effort is normal. No accessory muscle usage, prolonged expiration, respiratory distress or retractions.     Comments: Lungs clear to auscultation without adventitious lung sounds. Musculoskeletal:     Cervical back: Normal range of motion and neck supple.     Comments: No tenderness to palpation of the back, shoulder, elbow.  Full range of motion of back, shoulders, elbow.  Strength normal ankle bilaterally.  Sensation intact and equal bilaterally.  No obvious  tenderness to palpation of bilateral wrist, fingers.  Full range of motion of wrist and fingers, though with increased pain of fingers during flexion of right wrist.  Strength normal and equal bilaterally.  Sensation 3/5 diffusely of 1st-3rd fingers.  Radial pulse 2+, cap refill less than 2 seconds.  Positive Tinel's, Phalen's.  Negative Finkelstein's.  Neurological:     General: No focal deficit present.     Mental Status: She is alert and oriented to person, place, and time.      UC Treatments / Results  Labs (all labs ordered are listed, but only abnormal results are displayed) Labs Reviewed - No data to display  EKG   Radiology No results found.  Procedures Procedures (including critical care time)  Medications Ordered in UC Medications - No data to display  Initial Impression / Assessment and Plan / UC Course  I have reviewed the triage vital signs and the nursing notes.  Pertinent labs & imaging results that were available during my care of the patient were reviewed by me and considered in my medical decision making (see chart for details).    History and exam most consistent with carpal tunnel.  Lower suspicion for diabetic neuropathy at this time.  Given history of diabetes, will defer prednisone at this time.  Mobic, ice compress, wrist splint during activity and sleep.  Patient to follow-up with orthopedics for further evaluation management needed.  Return precautions given.    Final Clinical Impressions(s) / UC Diagnoses   Final diagnoses:  Carpal tunnel syndrome of right wrist   ED Prescriptions    Medication Sig Dispense Auth. Provider   meloxicam (MOBIC) 7.5 MG tablet Take 1 tablet (7.5 mg total) by mouth daily. 15 tablet Ok Edwards, PA-C     PDMP not reviewed this encounter.   Ok Edwards, PA-C 06/01/19 2022

## 2019-06-05 ENCOUNTER — Ambulatory Visit: Payer: Self-pay | Admitting: Nurse Practitioner

## 2019-06-09 ENCOUNTER — Ambulatory Visit (INDEPENDENT_AMBULATORY_CARE_PROVIDER_SITE_OTHER): Payer: No Typology Code available for payment source | Admitting: Family

## 2019-06-09 ENCOUNTER — Other Ambulatory Visit: Payer: Self-pay

## 2019-06-09 ENCOUNTER — Encounter: Payer: Self-pay | Admitting: Family

## 2019-06-09 VITALS — BP 144/90 | HR 101 | Temp 96.9°F | Ht 67.52 in | Wt 243.4 lb

## 2019-06-09 DIAGNOSIS — I471 Supraventricular tachycardia, unspecified: Secondary | ICD-10-CM

## 2019-06-09 DIAGNOSIS — H6122 Impacted cerumen, left ear: Secondary | ICD-10-CM

## 2019-06-09 DIAGNOSIS — E1159 Type 2 diabetes mellitus with other circulatory complications: Secondary | ICD-10-CM | POA: Diagnosis not present

## 2019-06-09 DIAGNOSIS — G4700 Insomnia, unspecified: Secondary | ICD-10-CM

## 2019-06-09 DIAGNOSIS — G5603 Carpal tunnel syndrome, bilateral upper limbs: Secondary | ICD-10-CM | POA: Diagnosis not present

## 2019-06-09 DIAGNOSIS — E119 Type 2 diabetes mellitus without complications: Secondary | ICD-10-CM | POA: Diagnosis not present

## 2019-06-09 DIAGNOSIS — I10 Essential (primary) hypertension: Secondary | ICD-10-CM

## 2019-06-09 MED ORDER — MELOXICAM 7.5 MG PO TABS: 7.5000 mg | ORAL_TABLET | Freq: Every day | ORAL | 0 refills | Status: DC

## 2019-06-09 NOTE — Progress Notes (Signed)
Provider: Marlowe Sax FNP-C   Patient, No Pcp Per  Patient Care Team: Patient, No Pcp Per as PCP - General (General Practice) Seward Grater, MD (Endocrinology)  Extended Emergency Contact Information Primary Emergency Contact: Frances Rodriguez Home Phone: [phone] Work Phone: [phone] Relation: Niece  Code Status: Full Code  Goals of care: Advanced Directive information No flowsheet data found.   Chief Complaint  Patient presents with  . Establish Care    New patient to establish care, she would like to discuss right hand recently told at a urgent care she has carpal tunnel and would like a referral to orthopedic specialist   . Quality Metric Gaps    patient refused flu vaccine and tetanus vaccine     HPI:  Pt is a 59 y.o. female seen today establish care for medical management of chronic diseases.she lives in [place] but came to [place] one month ago to take care of her sister.Her children are in [place]. she has a medical history of type 2 diabetes follows up with Endocrinologist in [place] lab work recently done Hgb A1C was 7.7 currently on metformin 1000 mg tablet twice daily,dulaglutide 1.5 mg weekly and glipizide 10 mg tablet twice daily.CBG glucometer log shows some readings in the 70's though states she was in pain did not get up to eat her breakfast she got shaky and niece gave Juice.overall CBG ranging in the 120's-200's.    Carpel tunnel - states went to urgent care  06/01/2019 due to hand pain.she was diagnosed with carpel tunnel.right hand brace ordered.she was told to follow up with PCP and orthopedic.she has no PCP currently.she request referral to Ortho.she was prescribed Mobic 7.5 mg tablet daily though states pain still not controlled.has pain in both hands but worst on right.Has numbness and tingling sensation on the tip of the fingers.she is retired but states used to type when she was working.she denies any weakness on the hand.   Elevated blood  pressure - Her B/p was 171/94 with HR 107 when she went to urgent care.B/p 144/90,HR 101 today.Possible due to pain ?anxious she denies any palpitation ,chest pain,shortness of breath,dizziness or headache.  Insomnia - on Amitriptyline 25 mg tablet at bedtime.used to take Diphenhydramine 25 mg capsule and sleep aid but states nothing works for her.  Health maintenance:  She declines influenza and Tdap vaccine. Also due for colonoscopy,pap smear ,hep C and HIV screening but will obtain records from previous provider.  States drinks 6 glasses of wine and sometimes Vodka mixed with orange juice per week." 1 glass in the evening". Also drinks coffee 2-3 cups per week.   Past Medical History:  Diagnosis Date  . Diabetes mellitus without complication [facility])    Past Surgical History:  Procedure Laterality Date  . BREAST BIOPSY Left    2013 benign   . GASTRIC BYPASS  2005    No Known Allergies  Allergies as of 06/09/2019   No Known Allergies     Medication List       Accurate as of June 09, 2019 11:41 AM. If you have any questions, ask your nurse or doctor.        acetaminophen 500 MG tablet Commonly known as: TYLENOL Take 500 mg by mouth as needed.   amitriptyline 50 MG tablet Commonly known as: ELAVIL Take 25 mg by mouth at bedtime.   CoQ10 200 MG Caps Take 2 capsules by mouth daily.   diphenhydrAMINE HCl (Sleep) 25 MG Caps Take 2 capsules by mouth  at bedtime.   Dulaglutide 1.5 MG/0.5ML Sopn Inject 1.5 mg into the skin once a week.   glipiZIDE 10 MG tablet Commonly known as: GLUCOTROL Take 10 mg by mouth 2 (two) times daily before a meal.   meloxicam 7.5 MG tablet Commonly known as: Mobic Take 1 tablet (7.5 mg total) by mouth daily.   metFORMIN 1000 MG tablet Commonly known as: GLUCOPHAGE Take 1,000 mg by mouth 2 (two) times daily with a meal.       Review of Systems  Constitutional: Negative for appetite change, chills, fatigue and fever.  HENT: Negative  for congestion, postnasal drip, rhinorrhea, sinus pressure, sinus pain, sneezing and sore throat.   Eyes: Positive for visual disturbance. Negative for discharge, redness and itching.       Wears eye glasses and contact sees Ophthalmology in [place]   Respiratory: Negative for cough, chest tightness, shortness of breath and wheezing.   Cardiovascular: Negative for chest pain, palpitations and leg swelling.  Gastrointestinal: Negative for abdominal distention, abdominal pain, constipation, diarrhea, nausea and vomiting.  Endocrine: Negative for cold intolerance, heat intolerance, polydipsia, polyphagia and polyuria.  Genitourinary: Negative for difficulty urinating, dysuria, flank pain, frequency and urgency.  Musculoskeletal: Positive for arthralgias. Negative for gait problem and myalgias.  Skin: Negative for color change, pallor and rash.       Intermittent eczema on the face sees dermatology in [place]   Neurological: Negative for dizziness, light-headedness and headaches.       Right hand numbness   Hematological: Does not bruise/bleed easily.  Psychiatric/Behavioral: Positive for sleep disturbance. Negative for agitation and behavioral problems. The patient is not nervous/anxious.      There is no immunization history on file for this patient. Pertinent  Health Maintenance Due  Topic Date Due  . URINE MICROALBUMIN  01/30/1970  . PAP SMEAR-Modifier  01/30/1981  . COLONOSCOPY  01/30/2010  . INFLUENZA VACCINE  12/31/2018  . MAMMOGRAM  05/22/2021   No flowsheet data found.  Vitals:   06/09/19 1113  BP: (!) 144/90  Pulse: (!) 101  Temp: (!) 96.9 F (36.1 C)  TempSrc: Temporal  SpO2: 98%  Weight: 243 lb 6.4 oz (110.4 kg)  Height: 5' 7.52" (1.715 m)   Body mass index is 37.54 kg/m. Physical Exam Vitals reviewed.  Constitutional:      General: She is not in acute distress.    Appearance: She is obese. She is not ill-appearing.  HENT:     Head: Normocephalic.      Right Ear: Tympanic membrane, ear canal and external ear normal. There is no impacted cerumen.     Left Ear: There is impacted cerumen.     Nose: Nose normal. No congestion or rhinorrhea.     Mouth/Throat:     Mouth: Mucous membranes are moist.     Pharynx: Oropharynx is clear. No oropharyngeal exudate or posterior oropharyngeal erythema.  Eyes:     General: No scleral icterus.       Right eye: No discharge.        Left eye: No discharge.     Extraocular Movements: Extraocular movements intact.     Conjunctiva/sclera: Conjunctivae normal.     Pupils: Pupils are equal, round, and reactive to light.  Neck:     Vascular: No carotid bruit.  Cardiovascular:     Rate and Rhythm: Normal rate and regular rhythm.     Pulses: Normal pulses.     Heart sounds: Normal heart sounds. No murmur. No  friction rub. No gallop.   Pulmonary:     Effort: Pulmonary effort is normal. No respiratory distress.     Breath sounds: Normal breath sounds. No wheezing, rhonchi or rales.  Chest:     Chest wall: No tenderness.  Abdominal:     General: Bowel sounds are normal. There is no distension.     Palpations: Abdomen is soft. There is no mass.     Tenderness: There is no abdominal tenderness. There is no right CVA tenderness, left CVA tenderness, guarding or rebound.  Musculoskeletal:        General: No tenderness. Normal range of motion.     Cervical back: Normal range of motion. No rigidity or tenderness.     Right lower leg: No edema.     Left lower leg: No edema.  Lymphadenopathy:     Cervical: No cervical adenopathy.  Skin:    General: Skin is warm and dry.     Coloration: Skin is not pale.     Findings: No bruising, erythema or rash.  Neurological:     Mental Status: She is alert and oriented to person, place, and time.     Cranial Nerves: No cranial nerve deficit.     Sensory: No sensory deficit.     Motor: No weakness.     Coordination: Coordination normal.     Gait: Gait normal.   Psychiatric:        Mood and Affect: Mood is anxious. Mood is not depressed.        Behavior: Behavior normal.        Thought Content: Thought content normal.        Judgment: Judgment normal.     Comments: Anxious     Labs reviewed: No results for input(s): NA, K, CL, CO2, GLUCOSE, BUN, CREATININE, CALCIUM, MG, PHOS in the last 8760 hours. No results for input(s): AST, ALT, ALKPHOS, BILITOT, PROT, ALBUMIN in the last 8760 hours. No results for input(s): WBC, NEUTROABS, HGB, HCT, MCV, PLT in the last 8760 hours. No results found for: TSH No results found for: HGBA1C No results found for: CHOL, HDL, LDLCALC, LDLDIRECT, TRIG, CHOLHDL  Significant Diagnostic Results in last 30 days:  MM 3D SCREEN BREAST BILATERAL  Result Date: 06/05/2019 CLINICAL DATA:  Screening. EXAM: DIGITAL SCREENING BILATERAL MAMMOGRAM WITH TOMO AND CAD COMPARISON:  Previous exam(s). ACR Breast Density Category a: The breast tissue is almost entirely fatty. FINDINGS: There are no findings suspicious for malignancy. Images were processed with CAD. IMPRESSION: No mammographic evidence of malignancy. A result letter of this screening mammogram will be mailed directly to the patient. RECOMMENDATION: Screening mammogram in one year. (Code:SM-B-01Y) BI-RADS CATEGORY  1: Negative. Electronically Signed   By: Sande Brothers M.D.   On: 06/05/2019 15:00    Assessment/Plan 1.tachycardia  HR 101 b/min today.on chart review HR 107 during recent urgent care visit.decline issues with anxiety though seems to be anxious during visit. - EKG 12-Lead done today indicates normal sinus Rhythm with HR 94 b/min.  2. Hypertension associated with diabetes (HCC) B/p not at goal this visit.currently not on any medication.B/p during recent urgent care visit was 171/94.Asymptomatic.unclear if due to right hand pain.will have her check blood pressure at home then bring log to visit for evaluation.will start on Amlodipine if SBP > 140  - EKG 12-Lead  indicates SR as above.  3. Bilateral carpal tunnel syndrome Continue on mobic.Add Extra strength Tylenol 1000 mg tablet one by mouth twice daily.urgent referral to  Orthopedic.  - Ambulatory referral to Orthopedic Surgery - meloxicam (MOBIC) 7.5 MG tablet; Take 1 tablet (7.5 mg total) by mouth daily.  Dispense: 15 tablet; Refill: 0  4. Type 2 diabetes mellitus without complication, without long-term current use of insulin (HCC) Reports recent labs Hgb A1C 7.7 Her CBG glucometer log shows some readings in the 70's though states she was in pain did not get up to eat her breakfast she got shaky and niece gave Juice.overall CBG ranging in the 120's-200's. Advised to afford skipping meals.Managed by Endocrinology Dr.Ousman at [facility].[address],[place] [address] Telephone # [phone]   5. Insomnia, unspecified type Takes amitriptyline 25 mg tablet at bedtime.melatonin,sleep aid ineffective.Sleep hygiene recommended.   6. Impacted cerumen of left ear Left TM not visualized due to cerumen impaction.In a hurry today states sister is at home alone and [facility] will be coming in at 1pm need to be home by then.will lavage ear next visit.   Family/ staff Communication: Reviewed plan of care with patient and sister.   Labs/tests ordered: Awaiting medical records from endocrinology.patient to sign release of records this visit.   Next Visit : 1 week for Blood pressure check and left ear cerumen lavage.   Caesar Bookman, NP

## 2019-06-09 NOTE — Patient Instructions (Addendum)
Check blood pressure at home daily and record notify provider's office if B/p > 150/90  Follow up in 1 week to recheck blood pressure  - Extra strength tylenol 1000 mg tablet one by mouth twice daily for right hand pain.

## 2019-06-12 ENCOUNTER — Telehealth: Payer: Self-pay | Admitting: Endocrinology, Diabetes and Metabolism

## 2019-06-12 ENCOUNTER — Telehealth: Payer: 59 | Admitting: Endocrinology, Diabetes and Metabolism

## 2019-06-12 NOTE — Telephone Encounter (Signed)
Called and left a voice mail to initiate telemedicine visit. Left a voice mail.

## 2019-06-12 NOTE — Progress Notes (Signed)
Faxed records request from [facility] to [facility] medical records dept. Fx# [phone].

## 2019-06-15 ENCOUNTER — Ambulatory Visit: Payer: Self-pay | Admitting: Nurse Practitioner

## 2019-06-15 DIAGNOSIS — R200 Anesthesia of skin: Secondary | ICD-10-CM | POA: Insufficient documentation

## 2019-06-16 ENCOUNTER — Other Ambulatory Visit: Payer: Self-pay

## 2019-06-16 ENCOUNTER — Encounter: Payer: Self-pay | Admitting: Family

## 2019-06-16 ENCOUNTER — Ambulatory Visit (INDEPENDENT_AMBULATORY_CARE_PROVIDER_SITE_OTHER): Payer: No Typology Code available for payment source | Admitting: Family

## 2019-06-16 ENCOUNTER — Encounter: Payer: Self-pay | Admitting: Gastroenterology

## 2019-06-16 VITALS — BP 140/60 | HR 113 | Temp 98.3°F | Ht 68.0 in | Wt 241.0 lb

## 2019-06-16 DIAGNOSIS — Z23 Encounter for immunization: Secondary | ICD-10-CM

## 2019-06-16 DIAGNOSIS — I10 Essential (primary) hypertension: Secondary | ICD-10-CM

## 2019-06-16 DIAGNOSIS — Z1211 Encounter for screening for malignant neoplasm of colon: Secondary | ICD-10-CM

## 2019-06-16 DIAGNOSIS — Z114 Encounter for screening for human immunodeficiency virus [HIV]: Secondary | ICD-10-CM

## 2019-06-16 DIAGNOSIS — H6122 Impacted cerumen, left ear: Secondary | ICD-10-CM | POA: Diagnosis not present

## 2019-06-16 DIAGNOSIS — R000 Tachycardia, unspecified: Secondary | ICD-10-CM | POA: Diagnosis not present

## 2019-06-16 DIAGNOSIS — Z1159 Encounter for screening for other viral diseases: Secondary | ICD-10-CM

## 2019-06-16 DIAGNOSIS — E119 Type 2 diabetes mellitus without complications: Secondary | ICD-10-CM

## 2019-06-16 NOTE — Patient Instructions (Addendum)
-   Check blood pressure daily and Heart rate notify provider if B/P > 140 /90 and HR > 90  - Cut down on salt in diet.

## 2019-06-16 NOTE — Progress Notes (Signed)
Provider: Richarda Blade FNP-C  Frances Rodriguez, Frances Citrin, NP  Patient Care Team: Saree Krogh, Frances Citrin, NP as PCP - General (Family Medicine) Jaclyn Shaggy, MD (Endocrinology)  Extended Emergency Contact Information Primary Emergency Contact: Hosie Poisson Home Phone: [phone] Work Phone: [phone] Relation: Niece  Code Status:  Full Code  Goals of care: Advanced Directive information No flowsheet data found.   Chief Complaint  Patient presents with  . Follow-up     1 week follow-up on blood pressure    HPI:  Pt is a 59 y.o. female seen today for an acute visit for follow up high blood pressure.she is also here for left ear lavage. She states was seen 06/15/2019 by Orthopaedic Dr.Brown Berniece Andreas at [facility] for carpal Tunnel tunnel.Pain encouraged to sleep with hand straight.States was advised to insert hand in her pillow case to keep straight.shestates this helped with the pain last night.Mobic helpful.Electrodiagnostic also ordered then she will follow up with Orthopaedic.  Hypertension - no log for review but states bought blood pressure machine yesterday.she will have her sister's HHN show her how to use it.Her blood pressure has improved compared to previous visit though not at goal.she denies any headache,dizziness,palpitation or shortness of breath.she states feeling stress out from care for her sister.she does take a break once a week.Had an assist but contracted COVID-19 stopped coming.Awaiting [employer] company to approve an Geophysicist/field seismologist.she has her niece helping but has also burnt out.Her Heart rate  Stays above 100's.Attributes it to her stress though this seems to be ongoing.EKG done last visit showed normal Sinus Rhythm. Latest TSH level was within normal range.   Health Maintenance  She is due for Hep C and HIV screening.she does not recall screening in the past.considers self low risk. Also due for her annual diabetic foot exam.she does not recall seeing a podiatrist.Her  diabetes is managed by Endocrinologist in [place] but would like a podiatrist refer here in [place]. Pneumococcal vaccine - Has not received any pneumococcal vaccine.  Pap smear - states due for pap smear but would like referral to a Gynecologist that her sister recommended.she did not bring the Gynecologist number.she will call [facility] office the desired specialist name. She denies any acute issues.     Past Medical History:  Diagnosis Date  . Diabetes mellitus without complication [facility])    Past Surgical History:  Procedure Laterality Date  . BREAST BIOPSY Left    2013 benign   . GASTRIC BYPASS  2005    No Known Allergies  Outpatient Encounter Medications as of 06/16/2019  Medication Sig  . acetaminophen (TYLENOL) 500 MG tablet Take 500 mg by mouth as needed.  Marland Kitchen amitriptyline (ELAVIL) 50 MG tablet Take 25 mg by mouth at bedtime.  . Coenzyme Q10 (COQ10) 200 MG CAPS Take 2 capsules by mouth daily.  . diphenhydrAMINE HCl, Sleep, 25 MG CAPS Take 2 capsules by mouth at bedtime.  . Dulaglutide 1.5 MG/0.5ML SOPN Inject 1.5 mg into the skin once a week.   Marland Kitchen glipiZIDE (GLUCOTROL) 10 MG tablet Take 10 mg by mouth 2 (two) times daily before a meal.  . meloxicam (MOBIC) 7.5 MG tablet Take 1 tablet (7.5 mg total) by mouth daily.  . metFORMIN (GLUCOPHAGE) 1000 MG tablet Take 1,000 mg by mouth 2 (two) times daily with a meal.   No facility-administered encounter medications on file as of 06/16/2019.    Review of Systems  Constitutional: Negative for appetite change, chills, fatigue and fever.  HENT: Negative for congestion, rhinorrhea,  sinus pressure, sinus pain, sneezing and sore throat.   Eyes: Negative for pain, discharge, itching and visual disturbance.       Has upcoming appointment in march,2021   Respiratory: Negative for cough, chest tightness, shortness of breath and wheezing.   Cardiovascular: Negative for chest pain, palpitations and leg swelling.  Gastrointestinal: Negative for  abdominal distention, abdominal pain, constipation, diarrhea, nausea and vomiting.  Endocrine: Negative for cold intolerance, heat intolerance, polydipsia, polyphagia and polyuria.  Genitourinary: Negative for difficulty urinating, dysuria, flank pain, frequency and urgency.  Musculoskeletal: Positive for arthralgias. Negative for gait problem.       Right hand carpal tunnel was recent seen by orthopedic.  Skin: Negative for color change, pallor and rash.  Neurological: Negative for dizziness, weakness, light-headedness and headaches.  Psychiatric/Behavioral: Negative for agitation, confusion and sleep disturbance. The patient is not nervous/anxious.        Increased stress level caring for her sister who is wheelchair bound      There is no immunization history on file for this patient. Pertinent  Health Maintenance Due  Topic Date Due  . HEMOGLOBIN A1C  1960-10-29  . FOOT EXAM  01/30/1970  . OPHTHALMOLOGY EXAM  01/30/1970  . URINE MICROALBUMIN  01/30/1970  . PAP SMEAR-Modifier  01/30/1981  . COLONOSCOPY  01/30/2010  . INFLUENZA VACCINE  08/29/2020 (Originally 12/31/2018)  . MAMMOGRAM  05/22/2021   Fall Risk  06/16/2019  Falls in the past year? 0  Number falls in past yr: 0  Injury with Fall? 0    Vitals:   06/16/19 1002  BP: 140/60  Pulse: (!) 113  Temp: 98.3 F (36.8 C)  TempSrc: Oral  SpO2: 98%  Weight: 241 lb (109.3 kg)  Height: 5\' 8"  (1.727 m)   Body mass index is 36.64 kg/m. Physical Exam Vitals reviewed.  Constitutional:      General: She is not in acute distress.    Appearance: She is obese. She is not ill-appearing.  HENT:     Head: Normocephalic.     Right Ear: Tympanic membrane, ear canal and external ear normal. There is no impacted cerumen.     Left Ear: There is impacted cerumen.     Ears:     Comments: Left ear cerumen lavaged with warm water/peroxide by CMA.TM examined clear without any signs of infection.patient tolerated procedure well. No  instrument used.     Nose: Nose normal. No congestion or rhinorrhea.     Mouth/Throat:     Mouth: Mucous membranes are moist.     Pharynx: Oropharynx is clear. No oropharyngeal exudate or posterior oropharyngeal erythema.  Eyes:     General: No scleral icterus.       Right eye: No discharge.        Left eye: No discharge.     Conjunctiva/sclera: Conjunctivae normal.     Pupils: Pupils are equal, round, and reactive to light.  Neck:     Vascular: No carotid bruit.  Cardiovascular:     Rate and Rhythm: Regular rhythm. Tachycardia present.     Pulses: Normal pulses.     Heart sounds: Normal heart sounds. No murmur. No friction rub. No gallop.   Pulmonary:     Effort: Pulmonary effort is normal.     Breath sounds: Normal breath sounds. No wheezing, rhonchi or rales.  Chest:     Chest wall: No tenderness.  Abdominal:     General: Bowel sounds are normal. There is no distension.  Palpations: Abdomen is soft. There is no mass.     Tenderness: There is no abdominal tenderness. There is no right CVA tenderness, left CVA tenderness, guarding or rebound.  Musculoskeletal:        General: No swelling or tenderness. Normal range of motion.     Cervical back: Normal range of motion. No rigidity or tenderness.     Right lower leg: No edema.     Left lower leg: No edema.     Comments: Right wrist brace off during visit.   Lymphadenopathy:     Cervical: No cervical adenopathy.  Skin:    General: Skin is warm and dry.     Coloration: Skin is not pale.     Findings: No erythema.  Neurological:     Mental Status: She is alert and oriented to person, place, and time.     Cranial Nerves: No cranial nerve deficit.     Sensory: No sensory deficit.     Motor: No weakness.     Gait: Gait normal.  Psychiatric:        Mood and Affect: Mood normal.        Behavior: Behavior normal.        Thought Content: Thought content normal.        Judgment: Judgment normal.     Labs reviewed: No  results for input(s): NA, K, CL, CO2, GLUCOSE, BUN, CREATININE, CALCIUM, MG, PHOS in the last 8760 hours. No results for input(s): AST, ALT, ALKPHOS, BILITOT, PROT, ALBUMIN in the last 8760 hours. No results for input(s): WBC, NEUTROABS, HGB, HCT, MCV, PLT in the last 8760 hours. No results found for: TSH No results found for: HGBA1C No results found for: CHOL, HDL, LDLCALC, LDLDIRECT, TRIG, CHOLHDL  Significant Diagnostic Results in last 30 days:  MM 3D SCREEN BREAST BILATERAL  Result Date: 06/05/2019 CLINICAL DATA:  Screening. EXAM: DIGITAL SCREENING BILATERAL MAMMOGRAM WITH TOMO AND CAD COMPARISON:  Previous exam(s). ACR Breast Density Category a: The breast tissue is almost entirely fatty. FINDINGS: There are no findings suspicious for malignancy. Images were processed with CAD. IMPRESSION: No mammographic evidence of malignancy. A result letter of this screening mammogram will be mailed directly to the patient. RECOMMENDATION: Screening mammogram in one year. (Code:SM-B-01Y) BI-RADS CATEGORY  1: Negative. Electronically Signed   By: Kristopher Oppenheim M.D.   On: 06/05/2019 15:00    Assessment/Plan 1. Essential hypertension B/p has improved this visit though not at goal for Type 2 DM recommended starting on medication but has declined thinks it's more of her increased stress level due to caring for her sister who is wheelchair bound.Bought her blood pressure cuff yesterday. Encouraged to check blood pressure daily at home and notify provider if B/p > 140/90.Blood pressure log given this visit advised to bring log to visit.Also advised to cut down on salt intake in diet.she verbalized understanding. Exericse limited due to caring for the sister - CBC with Differential/Platelet; Future  2. 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 Will initiate metoprolol/refer to cardiologist if HR persist > 90 b/min.    3. Type 2 diabetes mellitus  without complication, without long-term current use of insulin (HCC) Latest HgbA1C 7.7 managed by Endocrinologist.continue current medication.continue to follow up with Endocrinologist.Has upcoming appointment for Ophthalmology in march,2021.  - Ambulatory referral to Podiatry  4. Encounter for screening colonoscopy Due for screening.reports no GI bleeding or dark stool. - Ambulatory referral to Gastroenterology  5. Encounter for hepatitis C screening test for low risk patient Due for screening.low risk - Hep C Antibody; Future  6. Encounter for screening for human immunodeficiency virus (HIV) Due for screening.low risk.  - HIV antibody (with reflex); Future  7. Need for 23-polyvalent pneumococcal polysaccharide vaccine Afebrile.never had a PNA vaccine.  - Pneumococcal polysaccharide vaccine 23-valent greater than or equal to 2yo subcutaneous/IM  8. Cerumen debris on tympanic membrane of left ear Left ear lavaged with warm water and peroxide done by CMA.Tolerated procedure.TM clear.reports no pain in ear.   Family/ staff Communication: Reviewed plan of care with patient  Labs/tests ordered: None   Next Appointment:   Caesar Bookman, NP

## 2019-06-22 ENCOUNTER — Encounter: Payer: Self-pay | Admitting: Neurology

## 2019-06-26 ENCOUNTER — Other Ambulatory Visit: Payer: Self-pay

## 2019-06-26 DIAGNOSIS — R200 Anesthesia of skin: Secondary | ICD-10-CM

## 2019-06-26 DIAGNOSIS — G5603 Carpal tunnel syndrome, bilateral upper limbs: Secondary | ICD-10-CM

## 2019-06-28 ENCOUNTER — Other Ambulatory Visit: Payer: Self-pay

## 2019-06-28 ENCOUNTER — Ambulatory Visit (INDEPENDENT_AMBULATORY_CARE_PROVIDER_SITE_OTHER): Payer: No Typology Code available for payment source | Admitting: Neurology

## 2019-06-28 DIAGNOSIS — R200 Anesthesia of skin: Secondary | ICD-10-CM | POA: Diagnosis not present

## 2019-06-28 DIAGNOSIS — G5623 Lesion of ulnar nerve, bilateral upper limbs: Secondary | ICD-10-CM

## 2019-06-28 DIAGNOSIS — G5603 Carpal tunnel syndrome, bilateral upper limbs: Secondary | ICD-10-CM

## 2019-06-28 NOTE — Procedures (Signed)
[facility]  [address] [place], [address]  [place], [place] [address] Tel: [phone] Fax:  [phone] Test Date:  06/28/2019  Patient: Frances Rodriguez DOB: 05-31-1961 Physician: Nita Sickle, DO  Sex: Female Height: 5\' 7"  Ref Phys: , MD  ID#: Kelli Hope Temp: 32.0C Technician:    Patient Complaints: This is a 59 year old female referred for evaluation of bilateral hand tingling.  NCV & EMG Findings: Extensive electrodiagnostic testing of the right upper extremity and additional studies of the left shows:  1. Right median sensory response is absent.  Left median sensory response shows prolonged latency (7.3 ms) and reduced amplitude (L4.4 V).  Bilateral ulnar sensory responses show prolonged latency (R3.2, L3.4 ms) and amplitude is reduced on the right side (R6.4 V).  Bilateral radial sensory responses are within normal limits. 2. Right median motor response shows severely prolonged latency (R13.1 ms) and reduced amplitude (2.7 mV).  Left median motor response shows prolonged latency (6.7 ms).  Of note, there is evidence of a median-to-ulnar crossover in the forearm as seen by a motor response stimulating at the ulnar-wrist and recording at the abductor pollicis brevis muscle.  Bilateral ulnar motor responses show slowed conduction velocity across the elbow (A Elbow-B Elbow, R36, L33 m/s).   3. Chronic motor axonal loss changes are isolated to the right abductor pollicis brevis muscle, without accompanied active denervation  Impression: 1. Right median neuropathy at or distal to the wrist (very severe), consistent with a clinical diagnosis of carpal tunnel syndrome.   2. Left median neuropathy at or distal to the wrist (severe), consistent with a clinical diagnosis of carpal tunnel syndrome.   3. Bilateral ulnar neuropathy with slowing across the elbow, purely demyelinating, moderate and worse on the right. 4. Incidentally, there is evidence of bilateral  Martin-Gruber anastomosis, a normal anatomic variant.    ___________________________ 46, DO    Nerve Conduction Studies Anti Sensory Summary Table   Site NR Peak (ms) Norm Peak (ms) P-T Amp (V) Norm P-T Amp  Left Median Anti Sensory (2nd Digit)  32C  Wrist    7.3 <3.6 4.4 >15  Right Median Anti Sensory (2nd Digit)  32C  Wrist NR  <3.6  >15  Left Radial Anti Sensory (Base 1st Digit)  32C  Wrist    2.6 <2.7 33.8 >14  Right Radial Anti Sensory (Base 1st Digit)  32C  Wrist    2.3 <2.7 31.3 >14  Left Ulnar Anti Sensory (5th Digit)  32C  Wrist    3.4 <3.1 17.9 >10  Right Ulnar Anti Sensory (5th Digit)  32C  Wrist    3.2 <3.1 6.4 >10   Motor Summary Table   Site NR Onset (ms) Norm Onset (ms) O-P Amp (mV) Norm O-P Amp Site1 Site2 Delta-0 (ms) Dist (cm) Vel (m/s) Norm Vel (m/s)  Left Median Motor (Abd Poll Brev)  32C  Wrist    6.7 <4.0 8.5 >6 Elbow Wrist 5.5 29.0 53 >50  Elbow    12.2  8.0  Ulnar-wrist crossover Elbow 7.7 0.0    Ulnar-wrist crossover    4.5  3.4         Right Median Motor (Abd Poll Brev)  32C  Wrist    13.1 <4.0 2.7 >6 Elbow Wrist 5.3 28.0 53 >50  Elbow    18.4  2.4  Ulnar-wrist crossover Elbow 14.1 0.0    Ulnar-wrist crossover    4.3  5.3         Left  Ulnar Motor (Abd Dig Minimi)  32C  Wrist    2.7 <3.1 10.0 >7 B Elbow Wrist 4.9 25.0 51 >50  B Elbow    7.6  9.0  A Elbow B Elbow 3.0 10.0 33 >50  A Elbow    10.6  7.9         Right Ulnar Motor (Abd Dig Minimi)  32C  Wrist    2.9 <3.1 10.4 >7 B Elbow Wrist 4.5 23.0 51 >50  B Elbow    7.4  10.2  A Elbow B Elbow 2.8 10.0 36 >50  A Elbow    10.2  8.8          EMG   Side Muscle Ins Act Fibs Psw Fasc Number Recrt Dur Dur. Amp Amp. Poly Poly. Comment  Right 1stDorInt Nml Nml Nml Nml Nml Nml Nml Nml Nml Nml Nml Nml N/A  Right Abd Poll Brev Nml Nml Nml Nml 2- Rapid Some 1+ Some 1+ Some 1+ N/A  Right PronatorTeres Nml Nml Nml Nml Nml Nml Nml Nml Nml Nml Nml Nml N/A  Right Biceps Nml Nml Nml Nml Nml  Nml Nml Nml Nml Nml Nml Nml N/A  Right Triceps Nml Nml Nml Nml Nml Nml Nml Nml Nml Nml Nml Nml N/A  Right Deltoid Nml Nml Nml Nml Nml Nml Nml Nml Nml Nml Nml Nml N/A  Right FlexDigProf 4,5 Nml Nml Nml Nml Nml Nml Nml Nml Nml Nml Nml Nml N/A  Right ABD Dig Min Nml Nml Nml Nml Nml Nml Nml Nml Nml Nml Nml Nml N/A  Left Abd Poll Brev Nml Nml Nml Nml Nml Nml Nml Nml Nml Nml Nml Nml N/A  Left ABD Dig Min Nml Nml Nml Nml Nml Nml Nml Nml Nml Nml Nml Nml N/A  Left FlexDigProf 4,5 Nml Nml Nml Nml Nml Nml Nml Nml Nml Nml Nml Nml N/A  Left PronatorTeres Nml Nml Nml Nml Nml Nml Nml Nml Nml Nml Nml Nml N/A  Left 1stDorInt Nml Nml Nml Nml Nml Nml Nml Nml Nml Nml Nml Nml N/A      Waveforms:

## 2019-07-04 ENCOUNTER — Ambulatory Visit (AMBULATORY_SURGERY_CENTER): Payer: Self-pay | Admitting: *Deleted

## 2019-07-04 ENCOUNTER — Other Ambulatory Visit: Payer: Self-pay

## 2019-07-04 VITALS — Temp 96.8°F | Ht 68.0 in | Wt 242.0 lb

## 2019-07-04 DIAGNOSIS — Z1211 Encounter for screening for malignant neoplasm of colon: Secondary | ICD-10-CM

## 2019-07-04 DIAGNOSIS — Z01818 Encounter for other preprocedural examination: Secondary | ICD-10-CM

## 2019-07-04 MED ORDER — NA SULFATE-K SULFATE-MG SULF 17.5-3.13-1.6 GM/177ML PO SOLN: ORAL | 0 refills | Status: DC

## 2019-07-04 NOTE — Progress Notes (Signed)
Patient is here in-person for PV. Patient denies any allergies to eggs or soy. Patient denies any problems with anesthesia/sedation. Patient denies any oxygen use at home. Patient denies taking any diet/weight loss medications or blood thinners. Patient is not being treated for MRSA or C-diff. EMMI education assisgned to the patient for the procedure, this was explained and instructions given to patient. COVID-19 screening test is on 2/11 1050am, the pt is aware. Pt is aware that care partner will wait in the car during procedure; if they feel like they will be too hot or cold to wait in the car; they may wait in the 4 th floor lobby. Patient is aware to bring only one care partner. We want them to wear a mask (we do not have any that we can provide them), practice social distancing, and we will check their temperatures when they get here.  I did remind the patient that their care partner needs to stay in the parking lot the entire time and have a cell phone available, we will call them when the pt is ready for discharge. Patient will wear mask into building.    Suprep $15 off coupon given to the patient.

## 2019-07-09 ENCOUNTER — Other Ambulatory Visit: Payer: Self-pay | Admitting: Endocrinology, Diabetes and Metabolism

## 2019-07-11 ENCOUNTER — Encounter: Payer: Self-pay | Admitting: Emergency Medicine

## 2019-07-11 ENCOUNTER — Telehealth: Payer: Self-pay | Admitting: Gastroenterology

## 2019-07-11 NOTE — Telephone Encounter (Signed)
Patient is calling state her insurance does not cover the prep solution and its too expensive is requesting an alternative.

## 2019-07-11 NOTE — Telephone Encounter (Signed)
Discussed different prep options with patient, she would like to use Miralax and Gatorade prep. I will send her updated instructions in Mychart. She states she lost her original paperwork

## 2019-07-13 ENCOUNTER — Ambulatory Visit (INDEPENDENT_AMBULATORY_CARE_PROVIDER_SITE_OTHER): Payer: No Typology Code available for payment source

## 2019-07-13 ENCOUNTER — Other Ambulatory Visit: Payer: Self-pay

## 2019-07-13 ENCOUNTER — Other Ambulatory Visit: Payer: Self-pay | Admitting: Gastroenterology

## 2019-07-13 DIAGNOSIS — Z1159 Encounter for screening for other viral diseases: Secondary | ICD-10-CM

## 2019-07-14 LAB — SARS CORONAVIRUS 2 (TAT 6-24 HRS): SARS Coronavirus 2: NEGATIVE

## 2019-07-18 ENCOUNTER — Other Ambulatory Visit: Payer: Self-pay

## 2019-07-18 ENCOUNTER — Encounter: Payer: Self-pay | Admitting: Gastroenterology

## 2019-07-18 ENCOUNTER — Ambulatory Visit (AMBULATORY_SURGERY_CENTER): Payer: No Typology Code available for payment source | Admitting: Gastroenterology

## 2019-07-18 VITALS — BP 124/69 | HR 83 | Temp 97.1°F | Resp 18 | Ht 68.0 in | Wt 242.0 lb

## 2019-07-18 DIAGNOSIS — Z1211 Encounter for screening for malignant neoplasm of colon: Secondary | ICD-10-CM

## 2019-07-18 MED ORDER — SODIUM CHLORIDE 0.9 % IV SOLN: 500.0000 mL | Freq: Once | INTRAVENOUS | Status: DC

## 2019-07-18 NOTE — Progress Notes (Signed)
Pt's states no medical or surgical changes since previsit or office visit.  Temp taken by JB VS taken by [place] 

## 2019-07-18 NOTE — Patient Instructions (Signed)
Please read handouts provided. Continue present medications. High Fiber Diet. Drink at least 64 ounces of water. Add a daily stool bulking agent such as Metamucil.      YOU HAD AN ENDOSCOPIC PROCEDURE TODAY AT THE [facility]:   Refer to the procedure report that was given to you for any specific questions about what was found during the examination.  If the procedure report does not answer your questions, please call your gastroenterologist to clarify.  If you requested that your care partner not be given the details of your procedure findings, then the procedure report has been included in a sealed envelope for you to review at your convenience later.  YOU SHOULD EXPECT: Some feelings of bloating in the abdomen. Passage of more gas than usual.  Walking can help get rid of the air that was put into your GI tract during the procedure and reduce the bloating. If you had a lower endoscopy (such as a colonoscopy or flexible sigmoidoscopy) you may notice spotting of blood in your stool or on the toilet paper. If you underwent a bowel prep for your procedure, you may not have a normal bowel movement for a few days.  Please Note:  You might notice some irritation and congestion in your nose or some drainage.  This is from the oxygen used during your procedure.  There is no need for concern and it should clear up in a day or so.  SYMPTOMS TO REPORT IMMEDIATELY:   Following lower endoscopy (colonoscopy or flexible sigmoidoscopy):  Excessive amounts of blood in the stool  Significant tenderness or worsening of abdominal pains  Swelling of the abdomen that is new, acute  Fever of 100F or higher   For urgent or emergent issues, a gastroenterologist can be reached at any hour by calling [phone].   DIET:  We do recommend a small meal at first, but then you may proceed to your regular diet.  Drink plenty of fluids but you should avoid alcoholic beverages for 24 hours.  ACTIVITY:   You should plan to take it easy for the rest of today and you should NOT DRIVE or use heavy machinery until tomorrow (because of the sedation medicines used during the test).    FOLLOW UP: Our staff will call the number listed on your records 48-72 hours following your procedure to check on you and address any questions or concerns that you may have regarding the information given to you following your procedure. If we do not reach you, we will leave a message.  We will attempt to reach you two times.  During this call, we will ask if you have developed any symptoms of COVID 19. If you develop any symptoms (ie: fever, flu-like symptoms, shortness of breath, cough etc.) before then, please call [phone].  If you test positive for Covid 19 in the 2 weeks post procedure, please call and report this information to [place].    If any biopsies were taken you will be contacted by phone or by letter within the next 1-3 weeks.  Please call us at [phone] if you have not heard about the biopsies in 3 weeks.    SIGNATURES/CONFIDENTIALITY: You and/or your care partner have signed paperwork which will be entered into your electronic medical record.  These signatures attest to the fact that that the information above on your After Visit Summary has been reviewed and is understood.  Full responsibility of the confidentiality of this discharge information lies with  you and/or your care-partner.

## 2019-07-18 NOTE — Op Note (Signed)
[facility] Patient Name: Frances Rodriguez Procedure Date: 07/18/2019 10:42 AM MRN: [id number] Endoscopist: Tressia Danas MD, MD Age: 59 Referring MD:  Date of Birth: August 11, 1960 Gender: Female Account #: [id number] Procedure:                Colonoscopy Indications:              Screening for colorectal malignant neoplasm                           No known family history of colon cancer or polyps                           Colonoscopy in 2013 in [place] with a poor prep at                            that time. Repeat exam recommended in 5 years. Medicines:                Monitored Anesthesia Care Procedure:                Pre-Anesthesia Assessment:                           - Prior to the procedure, a History and Physical                            was performed, and patient medications and                            allergies were reviewed. The patient's tolerance of                            previous anesthesia was also reviewed. The risks                            and benefits of the procedure and the sedation                            options and risks were discussed with the patient.                            All questions were answered, and informed consent                            was obtained. Prior Anticoagulants: The patient has                            taken no previous anticoagulant or antiplatelet                            agents. ASA Grade Assessment: II - A patient with                            mild systemic disease. After reviewing the risks  and benefits, the patient was deemed in                            satisfactory condition to undergo the procedure.                           After obtaining informed consent, the colonoscope                            was passed under direct vision. Throughout the                            procedure, the patient's blood pressure, pulse, and                            oxygen  saturations were monitored continuously. The                            Colonoscope was introduced through the anus and                            advanced to the 3 cm into the ileum. A second                            forward view of the right colon was performede. The                            colonoscopy was performed without difficulty. The                            patient tolerated the procedure well. The quality                            of the bowel preparation was good. The terminal                            ileum, ileocecal valve, appendiceal orifice, and                            rectum were photographed. Scope In: 10:42:54 AM Scope Out: 10:57:46 AM Scope Withdrawal Time: 0 hours 11 minutes 57 seconds  Total Procedure Duration: 0 hours 14 minutes 52 seconds  Findings:                 The perianal and digital rectal examinations were                            normal.                           Multiple small-mouthed diverticula were found in                            the entire colon.  The exam was otherwise without abnormality on                            direct and retroflexion views. Complications:            No immediate complications. Estimated Blood Loss:     Estimated blood loss: none. Impression:               - Diverticulosis in the entire examined colon.                           - The examination was otherwise normal on direct                            and retroflexion views.                           - No specimens collected. Recommendation:           - Patient has a contact number available for                            emergencies. The signs and symptoms of potential                            delayed complications were discussed with the                            patient. Return to normal activities tomorrow.                            Written discharge instructions were provided to the                            patient.                            - High fiber diet. Drink at least 64 ounces of                            water. Add a daily stool bulking agent such as                            Metamucil.                           - Continue present medications.                           - Repeat colonoscopy in 10 years for screening                            purposes, earlier with any new symptoms. Tressia Danas MD, MD 07/18/2019 11:04:43 AM This report has been signed electronically.

## 2019-07-21 ENCOUNTER — Telehealth: Payer: Self-pay

## 2019-07-21 ENCOUNTER — Telehealth: Payer: Self-pay | Admitting: *Deleted

## 2019-07-21 NOTE — Telephone Encounter (Signed)
LVM

## 2019-07-21 NOTE — Telephone Encounter (Signed)
  Follow up Call-  Call back number 07/18/2019  Post procedure Call Back phone  # [phone]  Permission to leave phone message Yes     Patient questions:  Do you have a fever, pain , or abdominal swelling? No. Pain Score  0 *  Have you tolerated food without any problems? Yes.    Have you been able to return to your normal activities? Yes.    Do you have any questions about your discharge instructions: Diet   No. Medications  No. Follow up visit  No.  Do you have questions or concerns about your Care? No.  Actions: * If pain score is 4 or above: No action needed, pain <4.  1. Have you developed a fever since your procedure? no  2.   Have you had an respiratory symptoms (SOB or cough) since your procedure? no  3.   Have you tested positive for COVID 19 since your procedure no  4.   Have you had any family members/close contacts diagnosed with the COVID 19 since your procedure?  no   If yes to any of these questions please route to Laverna Peace, RN and Jennye Boroughs, Charity fundraiser.

## 2019-07-24 ENCOUNTER — Other Ambulatory Visit: Payer: Self-pay | Admitting: Endocrinology, Diabetes and Metabolism

## 2019-07-27 ENCOUNTER — Telehealth: Payer: Self-pay | Admitting: Gastroenterology

## 2019-07-27 NOTE — Telephone Encounter (Signed)
[facility] called stating that prep for procedure is not covered by pt's insurance.

## 2019-07-27 NOTE — Telephone Encounter (Signed)
Spoke with patients pharmacy and let them know to cancel the prescription on file as patient was given an alternative prep.

## 2019-07-28 ENCOUNTER — Encounter: Payer: Self-pay | Admitting: Endocrinology, Diabetes and Metabolism

## 2019-08-01 ENCOUNTER — Telehealth: Payer: Self-pay

## 2019-08-01 NOTE — Telephone Encounter (Signed)
Ms. Serafini left a message on the nurse line wanting to schedule an appointment with Dr. Tamera Punt. I returned her call to schedule that appointment having to leave a message instructing patient to call back to schedule appointment. 3:27 pm.

## 2019-08-01 NOTE — Progress Notes (Signed)
Faxed records request from [facility] back to them. Instructed to contact [facility] directly again. Both phone and fax numbers were provided.

## 2019-08-25 ENCOUNTER — Telehealth (INDEPENDENT_AMBULATORY_CARE_PROVIDER_SITE_OTHER): Payer: No Typology Code available for payment source | Admitting: Endocrinology, Diabetes and Metabolism

## 2019-08-25 DIAGNOSIS — Z79899 Other long term (current) drug therapy: Secondary | ICD-10-CM

## 2019-08-25 DIAGNOSIS — E1165 Type 2 diabetes mellitus with hyperglycemia: Secondary | ICD-10-CM

## 2019-08-25 DIAGNOSIS — E559 Vitamin D deficiency, unspecified: Secondary | ICD-10-CM

## 2019-08-25 DIAGNOSIS — Z7984 Long term (current) use of oral hypoglycemic drugs: Secondary | ICD-10-CM

## 2019-08-25 MED ORDER — VITAMIN D (ERGOCALCIFEROL) 1.25 MG (50000 UT) PO CAPS
50000.00 [IU] | ORAL_CAPSULE | ORAL | 0 refills | Status: AC
Start: 2019-08-25 — End: 2020-08-24

## 2019-08-25 NOTE — Progress Notes (Signed)
[facility]   EndocrinologyTelemedicine Follow-up  via HIPAA compliant secure video link   Date 08/25/2019 (Start time 10 am - End Time 10:30 am)    Originating site (Patient location): Home   Distant site (Provider location): [facility]  Provider and Title:  Vela Prose, MD  Consent obtained: Yes (verbal)   Language: Kristin West is a 59 y.o. female who presents for an initial evaluation of Type 2 diabetes mellitus.  Her last visit was back in May 2020. Also has a H/O vitamin D deficiency. We reviewed her most recent labs.    Current symptoms/problems include hyperglycemia and have been unchanged. Symptoms have been present for several months.    Had surgery on her right arm to release compressed nerves on right UE. Will have same surgery on the LUE later on      The patient was initially diagnosed with Type 2 diabetes mellitus based on the following criteria:  DM diagnosed many years ago (ealry [phone]). Tried Victoza and Byetta. The insurance then stopped paying for them. On Trulicity now.    Known diabetic complications: none  Cardiovascular risk factors: diabetes mellitus  Current diabetic medications include trulicity, metformin and glipizide     Eye exam current (within one year): yes  Weight trend: stable  Prior visit with dietician: yes  Current diet: in general, a "healthy" diet    Current exercise: walking    Current monitoring regimen: home blood tests - 1 times daily  Home blood sugar records: low to mid 100 mg/dL overall   Any episodes of hypoglycemia? no    Is She on ACE inhibitor or angiotensin II receptor blocker?   No     The following portions of the patient's history were reviewed and updated as appropriate: allergies, current medications, past family history, past medical history, past social history, past surgical history and problem list.    No past medical history on file.  No past surgical history on  file.  No family history on file.  No Known Allergies  Social History     Socioeconomic History   . Marital status: Married     Spouse name: Not on file   . Number of children: Not on file   . Years of education: Not on file   . Highest education level: Not on file   Occupational History   . Not on file   Tobacco Use   . Smoking status: Never Smoker   . Smokeless tobacco: Never Used   Substance and Sexual Activity   . Alcohol use: Not on file   . Drug use: Not on file   . Sexual activity: Not on file   Other Topics Concern   . Not on file   Social History Narrative   . Not on file     Social Determinants of Health     Financial Resource Strain:    . Difficulty of Paying Living Expenses:    Food Insecurity:    . Worried About Programme researcher, broadcasting/film/video in the Last Year:    . Barista in the Last Year:    Transportation Needs:    . Freight forwarder (Medical):    Marland Kitchen Lack of Transportation (Non-Medical):    Physical Activity:    . Days of Exercise per Week:    . Minutes of Exercise per Session:    Stress:    .  Feeling of Stress :    Social Connections:    . Frequency of Communication with Friends and Family:    . Frequency of Social Gatherings with Friends and Family:    . Attends Religious Services:    . Active Member of Clubs or Organizations:    . Attends Banker Meetings:    Marland Kitchen Marital Status:    Intimate Partner Violence:    . Fear of Current or Ex-Partner:    . Emotionally Abused:    Marland Kitchen Physically Abused:    . Sexually Abused:      Current Outpatient Medications on File Prior to Visit   Medication Sig Dispense Refill   . diclofenac (VOLTAREN) 75 MG EC tablet Take 75 mg by mouth daily  0   . glipiZIDE (GLUCOTROL) 10 MG tablet TAKE 1 TABLET BY MOUTH TWO TIMES DAILY BEFORE MEALS 180 tablet 1   . glucose blood (ACCU-CHEK GUIDE) test strip Use twice daily 100 each 5   . metFORMIN (GLUMETZA) 1000 MG (MOD) 24 hr tablet Take 1 tablet (1,000 mg total) by mouth 2 (two) times daily 60 tablet 5   . Trulicity  1.5 MG/0.5ML Solution Pen-injector INJECT 1.5 MG INTO THE SKIN ONCE A WEEK 6 mL 0     No current facility-administered medications on file prior to visit.     There were no vitals filed for this visit.  Lab Results   Component Value Date    HGBA1C 7.7 (H) 05/23/2019    HGBA1C 7.3 (H) 11/07/2018    HGBA1C 8.2 (A) 11/10/2017     Lab Results   Component Value Date    CHOL 153 11/07/2018    TRIG 86 11/07/2018    HDL 102 11/07/2018    LDL 34 11/07/2018    ALT 85 (H) 05/23/2019    AST 115 (H) 05/23/2019    NA 138 05/23/2019    K 4.4 05/23/2019    CL 99 05/23/2019    CREAT 0.86 05/23/2019    BUN 18 05/23/2019    CO2 21 05/23/2019    TSH 2.130 11/07/2018    GLU 154 (H) 05/23/2019    HGBA1C 7.7 (H) 05/23/2019       Review of Systems    Constitutional: negative  Eyes: negative  Respiratory: negative  Cardiovascular: negative  Gastrointestinal: negative  Musculoskeletal: negative   Neurological: negative  Behavioral: negative  Skin: negative  Hematology: negative     Objective:      There were no vitals taken for this visit.     Constitutional: Oriented to person, place, and time and well-developed, well-nourished, and in no distress.   Head: Normocephalic and atraumatic.   Eyes: Conjunctivae and EOM are normal.      Assessment:     Type 2 DM  Vitamin D deficiency   She received the 1st of the Pfizer vaccine and will get the second dose in about 10 days     Plan:       1.  Rx changes: Continue with current pharmacotherapy for now with emphasis on prevention of hypoglycemia.    2.  She will get updated labs with her PCP in May.  3.  Start vitamin D50.000 IU weekly for 16 weeks the move to 50.000 IU monthly.  4.  Education: Reviewed 'ABCs' of diabetes management (respective goals in parentheses):  A1C (<7), blood pressure (<130/80), and cholesterol (LDL <100). Follow up on BP with PCP.  5.  Compliance at present is estimated  to be fair.  6.  Follow up: Follow up in June after completion of the labs.      Elijio Miles.H.Tamera Punt,  MD  [facility]

## 2019-10-11 ENCOUNTER — Other Ambulatory Visit: Payer: No Typology Code available for payment source

## 2019-10-11 ENCOUNTER — Other Ambulatory Visit: Payer: Self-pay | Admitting: Endocrinology, Diabetes and Metabolism

## 2019-10-13 ENCOUNTER — Ambulatory Visit: Payer: No Typology Code available for payment source | Admitting: Family

## 2020-02-29 ENCOUNTER — Other Ambulatory Visit: Payer: Self-pay | Admitting: Endocrinology, Diabetes and Metabolism

## 2020-06-03 ENCOUNTER — Encounter: Payer: Self-pay | Admitting: Family

## 2020-06-03 ENCOUNTER — Ambulatory Visit: Payer: Self-pay | Admitting: Family

## 2020-06-03 ENCOUNTER — Ambulatory Visit (INDEPENDENT_AMBULATORY_CARE_PROVIDER_SITE_OTHER): Payer: 59 | Admitting: Family

## 2020-06-03 ENCOUNTER — Other Ambulatory Visit: Payer: Self-pay

## 2020-06-03 ENCOUNTER — Telehealth: Payer: Self-pay | Admitting: *Deleted

## 2020-06-03 VITALS — BP 132/70 | HR 115 | Temp 97.8°F | Resp 16 | Ht 68.0 in | Wt 233.0 lb

## 2020-06-03 DIAGNOSIS — J069 Acute upper respiratory infection, unspecified: Secondary | ICD-10-CM

## 2020-06-03 DIAGNOSIS — I10 Essential (primary) hypertension: Secondary | ICD-10-CM | POA: Diagnosis not present

## 2020-06-03 DIAGNOSIS — Z114 Encounter for screening for human immunodeficiency virus [HIV]: Secondary | ICD-10-CM

## 2020-06-03 DIAGNOSIS — E119 Type 2 diabetes mellitus without complications: Secondary | ICD-10-CM | POA: Diagnosis not present

## 2020-06-03 DIAGNOSIS — Z1159 Encounter for screening for other viral diseases: Secondary | ICD-10-CM

## 2020-06-03 DIAGNOSIS — R002 Palpitations: Secondary | ICD-10-CM

## 2020-06-03 MED ORDER — DULAGLUTIDE 1.5 MG/0.5ML ~~LOC~~ SOAJ: 1.5000 mg | SUBCUTANEOUS | 1 refills | Status: DC

## 2020-06-03 MED ORDER — GLIPIZIDE 10 MG PO TABS: 10.0000 mg | ORAL_TABLET | Freq: Two times a day (BID) | ORAL | 1 refills | Status: DC

## 2020-06-03 MED ORDER — LORATADINE 10 MG PO TABS: 10.0000 mg | ORAL_TABLET | Freq: Every day | ORAL | 11 refills | Status: DC

## 2020-06-03 MED ORDER — METFORMIN HCL 1000 MG PO TABS
1000.0000 mg | ORAL_TABLET | Freq: Two times a day (BID) | ORAL | 1 refills | Status: DC
Start: 2020-06-03 — End: 2020-09-30

## 2020-06-03 MED ORDER — METOPROLOL TARTRATE 25 MG PO TABS
12.5000 mg | ORAL_TABLET | Freq: Two times a day (BID) | ORAL | 3 refills | Status: DC
Start: 2020-06-03 — End: 2021-05-29

## 2020-06-03 NOTE — Patient Instructions (Signed)
-   Notify provider or go to ED if symptoms worsen or fail to improve or having any chest pain or tightness

## 2020-06-03 NOTE — Telephone Encounter (Signed)
Patient called and stated that she went to the pharmacy to get her Trulicity Filled and it is going to be over $1000 and she cannot afford this. This is even with Insurance.   Patient stated that she is going to call her insurance company and see what is comparable that they will cover and let us know.

## 2020-06-03 NOTE — Progress Notes (Addendum)
Provider: Marlowe Sax FNP-C   Solae Norling, Nelda Bucks, NP  Patient Care Team: Soloman Mckeithan, Nelda Bucks, NP as PCP - General (Family Medicine) Seward Grater, MD (Endocrinology)  Extended Emergency Contact Information Primary Emergency Contact: Buford Dresser Home Phone: [phone] Work Phone: [phone] Relation: Niece  Code Status: Full Code  Goals of care: Advanced Directive information Advanced Directives 06/03/2020  Does Patient Have a Medical Advance Directive? No  Would patient like information on creating a medical advance directive? No - Patient declined     Chief Complaint  Patient presents with  . Medical Management of Chronic Issues    Medication refills/ Fasting Labs.    HPI:  Pt is a 60 y.o. female seen today for medical management of chronic diseases.  Felt sick yesterday " fatigue" not sleeping.felt like heart was beating off the chest had to sit down when she was washing the dishes.Felt short of breath with exertion.cough started last night.she denies any chest pain,faintness,tireness,feeling of passing out,dizziness,Nausea or vomiting Has loose stool but associates it with Metformin.No diarrhea but just loose. No abdominal pain or cramping.Appetite is good.  Hypertension - not checking at home was using her sister's B/p cuff but has moved out.   Type 2 DM - not checking blood sugars.run out of her medication.took some of her relatives.   Has not been in contact with person sick with COVID-19  Stabbing pain on left upper back shooting to the front chest after she had palpitation.    Past Medical History:  Diagnosis Date  . Diabetes mellitus without complication [facility])    Past Surgical History:  Procedure Laterality Date  . BREAST BIOPSY Left    2013 benign   . [facility]  15 years ago    [facility] pt  . GASTRIC BYPASS  2005  . TUBAL LIGATION      Allergies  Allergen Reactions  . Percocet [Oxycodone-Acetaminophen] Itching  . Tramadol Itching     Allergies as of 06/03/2020      Reactions   Percocet [oxycodone-acetaminophen] Itching   Tramadol Itching      Medication List       Accurate as of June 03, 2020 11:59 PM. If you have any questions, ask your nurse or doctor.        STOP taking these medications   amitriptyline 50 MG tablet Commonly known as: ELAVIL Stopped by: Sandrea Hughs, NP   CoQ10 200 MG Caps Stopped by: Nelda Bucks Adolphe Fortunato, NP   diphenhydrAMINE HCl (Sleep) 25 MG Caps Stopped by: Nelda Bucks Elise Gladden, NP     TAKE these medications   acetaminophen 500 MG tablet Commonly known as: TYLENOL Take 500 mg by mouth as needed.   Dulaglutide 1.5 MG/0.5ML Sopn Inject 1.5 mg into the skin once a week.   glipiZIDE 10 MG tablet Commonly known as: GLUCOTROL Take 1 tablet (10 mg total) by mouth 2 (two) times daily before a meal.   loratadine 10 MG tablet Commonly known as: CLARITIN Take 1 tablet (10 mg total) by mouth daily. Started by: Sandrea Hughs, NP   meloxicam 7.5 MG tablet Commonly known as: Mobic Take 1 tablet (7.5 mg total) by mouth daily.   metFORMIN 1000 MG tablet Commonly known as: GLUCOPHAGE Take 1 tablet (1,000 mg total) by mouth 2 (two) times daily with a meal.   metoprolol tartrate 25 MG tablet Commonly known as: LOPRESSOR Take 0.5 tablets (12.5 mg total) by mouth 2 (two) times daily. Started by: Nelda Bucks  Olaf Mesa, NP   OVER THE COUNTER MEDICATION Take 1 capsule by mouth 3 (three) times daily. Total Restore. Take medication with meals.       Review of Systems  Constitutional: Positive for appetite change and fatigue. Negative for chills and fever.  HENT: Positive for congestion and rhinorrhea. Negative for hearing loss, sinus pressure, sinus pain, sneezing, sore throat and trouble swallowing.   Eyes: Negative for discharge, redness and itching.  Respiratory: Positive for cough. Negative for chest tightness and wheezing.   Cardiovascular: Negative for chest pain, palpitations and  leg swelling.  Gastrointestinal: Negative for abdominal distention, abdominal pain, constipation, diarrhea, nausea and vomiting.       Loose stool  Endocrine: Negative for cold intolerance, heat intolerance, polydipsia, polyphagia and polyuria.  Genitourinary: Negative for decreased urine volume, difficulty urinating, dysuria, flank pain, frequency, urgency, vaginal bleeding and vaginal discharge.  Musculoskeletal: Negative for arthralgias, gait problem and myalgias.       Status post surgery on right hand and elbow  Skin: Negative for color change, pallor and rash.  Neurological: Negative for dizziness, speech difficulty, weakness, light-headedness and headaches.  Hematological: Does not bruise/bleed easily.  Psychiatric/Behavioral: Negative for agitation, behavioral problems, confusion and sleep disturbance. The patient is not nervous/anxious.     Immunization History  Administered Date(s) Administered  . PFIZER SARS-COV-2 Vaccination 08/14/2019, 09/05/2019, 04/20/2020  . Pneumococcal Polysaccharide-23 06/16/2019   Pertinent  Health Maintenance Due  Topic Date Due  . FOOT EXAM  Never done  . OPHTHALMOLOGY EXAM  Never done  . PAP SMEAR-Modifier  Never done  . URINE MICROALBUMIN  11/07/2019  . HEMOGLOBIN A1C  11/21/2019  . INFLUENZA VACCINE  08/29/2020 (Originally 12/31/2019)  . MAMMOGRAM  05/22/2021  . COLONOSCOPY (Pts 45-61yr Insurance coverage will need to be confirmed)  07/17/2029   Fall Risk  06/11/2020 06/03/2020 06/16/2019  Falls in the past year? 0 0 0  Number falls in past yr: 0 0 0  Injury with Fall? - 0 0   Functional Status Survey:    Vitals:   06/03/20 0916  BP: 132/70  Pulse: (!) 115  Resp: 16  Temp: 97.8 F (36.6 C)  SpO2: 98%  Weight: 233 lb (105.7 kg)  Height: _0  (1.727 m)   Body mass index is 35.43 kg/m. Physical Exam Constitutional:      General: She is not in acute distress.    Appearance: She is obese. She is not ill-appearing.  HENT:      Head: Normocephalic.     Right Ear: Tympanic membrane, ear canal and external ear normal. There is no impacted cerumen.     Left Ear: Tympanic membrane, ear canal and external ear normal. There is no impacted cerumen.     Nose: Nose normal. No congestion or rhinorrhea.     Mouth/Throat:     Mouth: Mucous membranes are moist.     Pharynx: Oropharynx is clear. No oropharyngeal exudate or posterior oropharyngeal erythema.  Eyes:     General: No scleral icterus.       Right eye: No discharge.        Left eye: No discharge.     Extraocular Movements: Extraocular movements intact.     Conjunctiva/sclera: Conjunctivae normal.     Pupils: Pupils are equal, round, and reactive to light.  Neck:     Vascular: No carotid bruit.  Cardiovascular:     Rate and Rhythm: Normal rate and regular rhythm.     Pulses: Normal pulses.  Heart sounds: Normal heart sounds. No murmur heard. No friction rub. No gallop.   Pulmonary:     Effort: Pulmonary effort is normal. No respiratory distress.     Breath sounds: Normal breath sounds. No wheezing, rhonchi or rales.  Chest:     Chest wall: No tenderness.  Abdominal:     General: Bowel sounds are normal. There is no distension.     Palpations: Abdomen is soft. There is no mass.     Tenderness: There is no abdominal tenderness. There is no left CVA tenderness, guarding or rebound.  Musculoskeletal:        General: No swelling or tenderness. Normal range of motion.     Cervical back: Normal range of motion. No rigidity or tenderness.     Right lower leg: No edema.     Left lower leg: No edema.  Lymphadenopathy:     Cervical: No cervical adenopathy.  Skin:    General: Skin is warm and dry.     Coloration: Skin is not pale.     Findings: No bruising, erythema or rash.  Neurological:     Mental Status: She is alert and oriented to person, place, and time.     Cranial Nerves: No cranial nerve deficit.     Sensory: No sensory deficit.     Motor: No  weakness.     Coordination: Coordination normal.     Gait: Gait normal.  Psychiatric:        Mood and Affect: Mood normal.        Behavior: Behavior normal.        Thought Content: Thought content normal.        Judgment: Judgment normal.     Labs reviewed: No results for input(s): NA, K, CL, CO2, GLUCOSE, BUN, CREATININE, CALCIUM, MG, PHOS in the last 8760 hours. No results for input(s): AST, ALT, ALKPHOS, BILITOT, PROT, ALBUMIN in the last 8760 hours. Recent Labs    06/03/20 1022  WBC 8.4  NEUTROABS 6,451  HGB 12.4  HCT 36.1  MCV 94.5  PLT 279   Lab Results  Component Value Date   TSH 1.470 06/07/2020   Lab Results  Component Value Date   HGBA1C 7.7 05/23/2019   Lab Results  Component Value Date   CHOL 153 11/07/2018   HDL 102 (A) 11/07/2018   LDLCALC 34 11/07/2018   TRIG 86 11/07/2018    Significant Diagnostic Results in last 30 days:  No results found.  Assessment/Plan  1. Essential hypertension B/p well controlled. - not on any medication.  - CMP with eGFR([facility]) - TSH - Lipid Panel - CBC with Differential/Platelet - EKG 12-Lead indicates sinus Tachycardia with right atrial enlargement HR 102 b/min previous EKG showed sinus rhythm HR 94 b/min.Start on Metoprolol tartrate 12.5 mg tablet one by mouth twice daily   2. Type 2 diabetes mellitus without complication, without long-term current use of insulin (HCC) Request medication refill - Lipid Panel - Hemoglobin A1c - metFORMIN (GLUCOPHAGE) 1000 MG tablet; Take 1 tablet (1,000 mg total) by mouth 2 (two) times daily with a meal.  Dispense: 90 tablet; Refill: 1 - Dulaglutide 1.5 MG/0.5ML SOPN; Inject 1.5 mg into the skin once a week.  Dispense: 90 mL; Refill: 1 - glipiZIDE (GLUCOTROL) 10 MG tablet; Take 1 tablet (10 mg total) by mouth 2 (two) times daily before a meal.  Dispense: 90 tablet; Refill: 1  3. Upper respiratory tract infection, unspecified type Afebrile. - POC Influenza A/B negative. -  SARS-COV-2 RNA,(COVID-19) QUAL NAAT - loratadine (CLARITIN) 10 MG tablet; Take 1 tablet (10 mg total) by mouth daily.  Dispense: 30 tablet; Refill: 11  4. Encounter for screening for human immunodeficiency virus (HIV) Reports no high risk behaviors.  - HIV antibody (with reflex)  5. Encounter for hepatitis C screening test for low risk patient Low risk. - Hep C Antibody  6. Palpitations Reports no chest pain.  - EKG 12-Lead sinus Tachycardia with right atrial enlargement HR 102 b/min previous EKG showed sinus rhythm HR 94 b/min.Start on Metoprolol tartrate 12.5 mg tablet one by mouth twice daily  - Ambulatory referral to Cardiology - metoprolol tartrate (LOPRESSOR) 25 MG tablet; Take 0.5 tablets (12.5 mg total) by mouth 2 (two) times daily.  Dispense: 180 tablet; Refill: 3   Family/ staff Communication: Reviewed plan of care with patient  Labs/tests ordered:  - CMP with eGFR([facility]) - TSH - Lipid Panel - CBC with Differential/Platelet - Hep C Antibody - HIV antibody (with reflex) - Lipid Panel - Hemoglobin A1c - EKG 12-Lead  Next Appointment : 1 week for follow up Palpitation.   Sandrea Hughs, NP

## 2020-06-03 NOTE — Telephone Encounter (Signed)
Ngetich, Dinah C, NP  You 37 minutes ago (1:32 PM)     Okay.will wait to hear from her what medication her insurance covers.   Routing comment

## 2020-06-03 NOTE — Telephone Encounter (Signed)
Patient notified and agreed.  

## 2020-06-03 NOTE — Telephone Encounter (Signed)
Patient called back and left voicemail with CI and stated that she checked with her insurance company about comparable medications and the Ozempic is even more than the Trulicity at $2000. Patient wants to know if you can come up with something different or just see how she des with what she is already on.

## 2020-06-03 NOTE — Telephone Encounter (Signed)
Had blood work today.will see what your hgb A1C level is prior to changing to another medication.

## 2020-06-04 LAB — CBC WITH DIFFERENTIAL/PLATELET
Absolute Monocytes: 697 cells/uL (ref 200–950)
Basophils Absolute: 59 cells/uL (ref 0–200)
Basophils Relative: 0.7 %
Eosinophils Absolute: 34 cells/uL (ref 15–500)
Eosinophils Relative: 0.4 %
HCT: 36.1 % (ref 35.0–45.0)
Hemoglobin: 12.4 g/dL (ref 11.7–15.5)
Lymphs Abs: 1159 cells/uL (ref 850–3900)
MCH: 32.5 pg (ref 27.0–33.0)
MCHC: 34.3 g/dL (ref 32.0–36.0)
MCV: 94.5 fL (ref 80.0–100.0)
MPV: 12.8 fL — ABNORMAL HIGH (ref 7.5–12.5)
Monocytes Relative: 8.3 %
Neutro Abs: 6451 cells/uL (ref 1500–7800)
Neutrophils Relative %: 76.8 %
Platelets: 279 10*3/uL (ref 140–400)
RBC: 3.82 10*6/uL (ref 3.80–5.10)
RDW: 12.1 % (ref 11.0–15.0)
Total Lymphocyte: 13.8 %
WBC: 8.4 10*3/uL (ref 3.8–10.8)

## 2020-06-04 LAB — HEPATITIS C ANTIBODY
Hepatitis C Ab: NONREACTIVE
SIGNAL TO CUT-OFF: 0.01 (ref <1.00)

## 2020-06-04 LAB — HIV ANTIBODY (ROUTINE TESTING W REFLEX): HIV 1&2 Ab, 4th Generation: NONREACTIVE

## 2020-06-07 ENCOUNTER — Other Ambulatory Visit: Payer: Self-pay

## 2020-06-07 ENCOUNTER — Ambulatory Visit (INDEPENDENT_AMBULATORY_CARE_PROVIDER_SITE_OTHER): Payer: Self-pay | Admitting: Interventional Cardiology

## 2020-06-07 ENCOUNTER — Telehealth: Payer: Self-pay | Admitting: Radiology

## 2020-06-07 ENCOUNTER — Encounter: Payer: Self-pay | Admitting: Interventional Cardiology

## 2020-06-07 VITALS — BP 108/68 | HR 89 | Ht 67.0 in | Wt 234.2 lb

## 2020-06-07 DIAGNOSIS — Z7189 Other specified counseling: Secondary | ICD-10-CM

## 2020-06-07 DIAGNOSIS — E119 Type 2 diabetes mellitus without complications: Secondary | ICD-10-CM

## 2020-06-07 DIAGNOSIS — R002 Palpitations: Secondary | ICD-10-CM

## 2020-06-07 DIAGNOSIS — R0602 Shortness of breath: Secondary | ICD-10-CM

## 2020-06-07 LAB — TSH: TSH: 1.47 u[IU]/mL (ref 0.450–4.500)

## 2020-06-07 LAB — PRO B NATRIURETIC PEPTIDE: NT-Pro BNP: 51 pg/mL (ref 0–287)

## 2020-06-07 NOTE — Patient Instructions (Signed)
Medication Instructions:  Your physician recommends that you continue on your current medications as directed. Please refer to the Current Medication list given to you today.  *If you need a refill on your cardiac medications before your next appointment, please call your pharmacy*   Lab Work: Pro BNP and TSH today  If you have labs (blood work) drawn today and your tests are completely normal, you will receive your results only by: Marland Kitchen MyChart Message (if you have MyChart) OR . A paper copy in the mail If you have any lab test that is abnormal or we need to change your treatment, we will call you to review the results.   Testing/Procedures: Your physician has requested that you have an echocardiogram. Echocardiography is a painless test that uses sound waves to create images of your heart. It provides your doctor with information about the size and shape of your heart and how well your heart's chambers and valves are working. This procedure takes approximately one hour. There are no restrictions for this procedure.  Your physician has recommended that you wear an event monitor. Event monitors are medical devices that record the heart's electrical activity. Doctors most often [place] these monitors to diagnose arrhythmias. Arrhythmias are problems with the speed or rhythm of the heartbeat. The monitor is a small, portable device. You can wear one while you do your normal daily activities. This is usually used to diagnose what is causing palpitations/syncope (passing out).   Follow-Up: At [facility], you and your health needs are our priority.  As part of our continuing mission to provide you with exceptional heart care, we have created designated Provider Care Teams.  These Care Teams include your primary Cardiologist (physician) and Advanced Practice Providers (APPs -  Physician Assistants and Nurse Practitioners) who all work together to provide you with the care you need, when you need  it.  We recommend signing up for the patient portal called "MyChart".  Sign up information is provided on this After Visit Summary.  MyChart is used to connect with patients for Virtual Visits (Telemedicine).  Patients are able to view lab/test results, encounter notes, upcoming appointments, etc.  Non-urgent messages can be sent to your provider as well.   To learn more about what you can do with MyChart, go to [email or web address].    Your next appointment:   As needed  The format for your next appointment:   In Person  Provider:   You may see Dr. Verdis Prime or one of the following Advanced Practice Providers on your designated Care Team:    Norma Fredrickson, NP  Nada Boozer, NP  Georgie Chard, NP    Other Instructions  [employer] Cardiac Event Monitor Instructions Your physician has requested you wear your cardiac event monitor for 30 days. [employer] may call or text to confirm a shipping address. The monitor will be sent to a land address via UPS. [employer] will not ship a monitor to a PO BOX. It typically takes 3-5 days to receive your monitor after it has been enrolled. [employer] will assist with USPS tracking if your package is delayed. The telephone number for [employer] is [phone]. Once you have received your monitor, please review the enclosed instructions. Instruction tutorials can also be viewed under help and settings on the enclosed cell phone. Your monitor has already been registered assigning a specific monitor serial # to you.  Applying the monitor Remove cell phone from case and turn it on. The cell phone works as your  transmitter and needs to be within 10 feet of you at all times. The cell phone will need to be charged on a daily basis. We recommend you plug the cell phone into the enclosed charger at your bedside table every night.  Monitor batteries: You will receive two monitor batteries labelled #1 and #2. These are your recorders.  Plug battery #2 onto the second connection on the enclosed charger. Keep one battery on the charger at all times. This will keep the monitor battery deactivated. It will also keep it fully charged for when you need to switch your monitor batteries. A small light will be blinking on the battery emblem when it is charging. The light on the battery emblem will remain on when the battery is fully charged.  Open package of a Monitor strip. Insert battery #1 into black hood on strip and gently squeeze monitor battery onto connection as indicated in instruction booklet. Set aside while preparing skin.  Choose location for your strip, vertical or horizontal, as indicated in the instruction booklet. Shave to remove all hair from location. There cannot be any lotions, oils, powders, or colognes on skin where monitor is to be applied. Wipe skin clean with enclosed Saline wipe. Dry skin completely.  Peel paper labeled #1 off the back of the Monitor strip exposing the adhesive. Place the monitor on the chest in the vertical or horizontal position shown in the instruction booklet. One arrow on the monitor strip must be pointing upward. Carefully remove paper labeled #2, attaching remainder of strip to your skin. Try not to create any folds or wrinkles in the strip as you apply it.  Firmly press and release the circle in the center of the monitor battery. You will hear a small beep. This is turning the monitor battery on. The heart emblem on the monitor battery will light up every 5 seconds if the monitor battery in turned on and connected to the patient securely. Do not push and hold the circle down as this turns the monitor battery off. The cell phone will locate the monitor battery. A screen will appear on the cell phone checking the connection of your monitor strip. This may read poor connection initially but change to good connection within the next minute. Once your monitor accepts the connection you  will hear a series of 3 beeps followed by a climbing crescendo of beeps. A screen will appear on the cell phone showing the two monitor strip placement options. Touch the picture that demonstrates where you applied the monitor strip.  Your monitor strip and battery are waterproof. You are able to shower, bathe, or swim with the monitor on. They just ask you do not submerge deeper than 3 feet underwater. We recommend removing the monitor if you are swimming in a lake, river, or ocean.  Your monitor battery will need to be switched to a fully charged monitor battery approximately once a week. The cell phone will alert you of an action which needs to be made.  On the cell phone, tap for details to reveal connection status, monitor battery status, and cell phone battery status. The green dots indicates your monitor is in good status. A red dot indicates there is something that needs your attention.  To record a symptom, click the circle on the monitor battery. In 30-60 seconds a list of symptoms will appear on the cell phone. Select your symptom and tap save. Your monitor will record a sustained or significant arrhythmia regardless of you  clicking the button. Some patients do not feel the heart rhythm irregularities. [employer] will notify us of any serious or critical events.  Refer to instruction booklet for instructions on switching batteries, changing strips, the Do not disturb or Pause features, or any additional questions.  Call [employer] at [phone], to confirm your monitor is transmitting and record your baseline. They will answer any questions you may have regarding the monitor instructions at that time.  Returning the monitor to [employer] Place all equipment back into blue box. Peel off strip of paper to expose adhesive and close box securely. There is a prepaid UPS shipping label on this box. Drop in a UPS drop box, or at a UPS facility like [employer]. You may also contact  [employer] to arrange UPS to pick up monitor package at your home.

## 2020-06-07 NOTE — Progress Notes (Signed)
Cardiology Office Note:    Date:  06/07/2020   ID:  Frances Rodriguez, DOB [birth year], MRN [id number]  PCP:  Caesar Bookman, NP  Cardiologist:  No primary care provider on file.   Referring MD: Caesar Bookman, NP   No chief complaint on file.   History of Present Illness:    Frances Rodriguez is a 60 y.o. female with a hx of diabetes mellitus II, obesity, history of gastric bypass 2005 who is referred by primary care because of exertional dyspnea and increased heart rates.  Over the past 12 months but more bothersome over the past 2 months, the patient has noted dyspnea on exertion.  She notices that there is increase in heart rate with minimal physical activity.  After walking her dog yesterday which is a common task, several minutes after returning home her heart rate was over 130 bpm.  She also has episodes where she can feel her heart pounding and it is associated with shortness of breath and uneasiness.  No chest pain.  No edema, syncope, or near syncope has occurred.  Walking does not cause leg discomfort.  Lost her job last year and went without diabetes control for the entire year.  She is now back on therapy.  Past Medical History:  Diagnosis Date  . Diabetes mellitus without complication [facility])     Past Surgical History:  Procedure Laterality Date  . BREAST BIOPSY Left    2013 benign   . COLONOSCOPY  15 years ago    [facility]  . GASTRIC BYPASS  2005  . TUBAL LIGATION      Current Medications: Current Meds  Medication Sig  . acetaminophen (TYLENOL) 500 MG tablet Take 500 mg by mouth as needed.  . Dulaglutide 1.5 MG/0.5ML SOPN Inject 1.5 mg into the skin once a week.  Marland Kitchen glipiZIDE (GLUCOTROL) 10 MG tablet Take 1 tablet (10 mg total) by mouth 2 (two) times daily before a meal.  . loratadine (CLARITIN) 10 MG tablet Take 1 tablet (10 mg total) by mouth daily.  . metFORMIN (GLUCOPHAGE) 1000 MG tablet Take 1 tablet (1,000 mg total) by mouth 2 (two) times  daily with a meal.  . metoprolol tartrate (LOPRESSOR) 25 MG tablet Take 0.5 tablets (12.5 mg total) by mouth 2 (two) times daily.  Marland Kitchen OVER THE COUNTER MEDICATION Take 1 capsule by mouth 3 (three) times daily. Total Restore. Take medication with meals.     Allergies:   Percocet [oxycodone-acetaminophen] and Tramadol   Social History   Socioeconomic History  . Marital status: Divorced    Spouse name: Not on file  . Number of children: Not on file  . Years of education: 101  . Highest education level: High school graduate  Occupational History  . Not on file  Tobacco Use  . Smoking status: Never Smoker  . Smokeless tobacco: Never Used  Vaping Use  . Vaping Use: Never used  Substance and Sexual Activity  . Alcohol use: Yes    Alcohol/week: 3.0 standard drinks    Types: 3 Glasses of wine per week    Comment: weekly  . Drug use: Not Currently  . Sexual activity: Not on file  Other Topics Concern  . Not on file  Social History Narrative   Social History      Diet? n/a      Do you drink/eat things with caffeine? coffee      Marital status?  Divorced                            What year were you married? 1980      Do you live in a house, apartment, assisted living, condo, trailer, etc.? house      Is it one or more stories? 2      How many persons live in your home? 2      Do you have any pets in your home? (please list) yes , dog      Highest level of education completed? 12 high school      Current or past profession: supervisor       Do you exercise?          No                             Type & how often?      Advanced Directives      Do you have a living will? No       Do you have a DNR form?      No                             If not, do you want to discuss one? No       Do you have signed POA/HPOA for forms? No       Functional Status      Do you have difficulty bathing or dressing yourself? No       Do you have difficulty preparing food or  eating? No       Do you have difficulty managing your medications? No       Do you have difficulty managing your finances? No       Do you have difficulty affording your medications? No          Social Determinants of Radio broadcast assistant Strain: Not on file  Food Insecurity: Not on file  Transportation Needs: Not on file  Physical Activity: Not on file  Stress: Not on file  Social Connections: Not on file     Family History: The patient's family history includes Breast cancer in her sister; Breast cancer (age of onset: 51) in her mother; Throat cancer in her father. There is no history of Colon cancer, Colon polyps, Esophageal cancer, Stomach cancer, or Rectal cancer.  There is a maternal family history of CAD.  Her mothers brothers had stents, all maternal aunt has multiple stents, and her aunts and daughter has CAD.  ROS:   Please see the history of present illness.    Denies observed snoring.  She is not married.  Drinks wine daily, 1 to 2 glasses.  This pattern of drinking has been going on for 3 to 4 years.  Heart rate last evening after walking her dog was 130 bpm.  All other systems reviewed and are negative.  EKGs/Labs/Other Studies Reviewed:    The following studies were reviewed today: No cardiac imaging  EKG:  EKG performed on 06/03/2020 revealed right atrial abnormality but otherwise normal rhythm at a heart rate of 80 bpm.  Recent Labs: 06/03/2020: Hemoglobin 12.4; Platelets 279  Recent Lipid Panel    Component Value Date/Time   CHOL 153 11/07/2018 0000   TRIG 86 11/07/2018 0000   HDL 102 (A) 11/07/2018 0000  LDLCALC 34 11/07/2018 0000    Physical Exam:    VS:  BP 108/68   Pulse 89   Ht 5\' 7"  (1.702 m)   Wt 234 lb 3.2 oz (106.2 kg)   SpO2 98%   BMI 36.68 kg/m     Wt Readings from Last 3 Encounters:  06/07/20 234 lb 3.2 oz (106.2 kg)  06/03/20 233 lb (105.7 kg)  07/18/19 242 lb (109.8 kg)     GEN: Obese, BMI 37.. No acute distress HEENT:  Normal NECK: No JVD. LYMPHATICS: No lymphadenopathy CARDIAC: No murmur. RRR S4 but no S3 gallop, or edema. VASCULAR:  Normal Pulses. No bruits. RESPIRATORY:  Clear to auscultation without rales, wheezing or rhonchi  ABDOMEN: Soft, non-tender, non-distended, No pulsatile mass, MUSCULOSKELETAL: No deformity  SKIN: Warm and dry NEUROLOGIC:  Alert and oriented x 3 PSYCHIATRIC:  Normal affect   ASSESSMENT:    1. Palpitations   2. SOB (shortness of breath)   3. Type 2 diabetes mellitus without complication, without long-term current use of insulin (HCC)   4. Educated about COVID-19 virus infection    PLAN:    In order of problems listed above:  1. Uncharacteristically high heart rates measuring into the 130s with mild to moderate physical activity.  Also spontaneous episodes of elevated heart rate associated with dyspnea.  A 4-week monitor will be done to rule out paroxysmal atrial arrhythmias or other dysrhythmia.  Also interested in whether the increased heart rates with physical activity or simply sinus tachycardia. 2. 2D Doppler echocardiogram, TSH, and BNP. 3. Now back on therapy for diabetes including Metformin 1000 mg twice daily Glucotrol 10 mg twice daily and dulaglutide 1.5 mg once a week. 4. Vaccinated and practicing medication.  Follow-up will be pending database.   Medication Adjustments/Labs and Tests Ordered: Current medicines are reviewed at length with the patient today.  Concerns regarding medicines are outlined above.  Orders Placed This Encounter  Procedures  . Pro b natriuretic peptide  . TSH  . Cardiac event monitor  . ECHOCARDIOGRAM COMPLETE   No orders of the defined types were placed in this encounter.   Patient Instructions  Medication Instructions:  Your physician recommends that you continue on your current medications as directed. Please refer to the Current Medication list given to you today.  *If you need a refill on your cardiac medications  before your next appointment, please call your pharmacy*   Lab Work: Pro BNP and TSH today  If you have labs (blood work) drawn today and your tests are completely normal, you will receive your results only by: 07/20/19 MyChart Message (if you have MyChart) OR . A paper copy in the mail If you have any lab test that is abnormal or we need to change your treatment, we will call you to review the results.   Testing/Procedures: Your physician has requested that you have an echocardiogram. Echocardiography is a painless test that uses sound waves to create images of your heart. It provides your doctor with information about the size and shape of your heart and how well your heart's chambers and valves are working. This procedure takes approximately one hour. There are no restrictions for this procedure.  Your physician has recommended that you wear an event monitor. Event monitors are medical devices that record the heart's electrical activity. Doctors most often Marland Kitchen these monitors to diagnose arrhythmias. Arrhythmias are problems with the speed or rhythm of the heartbeat. The monitor is a small, portable device. You can  wear one while you do your normal daily activities. This is usually used to diagnose what is causing palpitations/syncope (passing out).   Follow-Up: At [facility], you and your health needs are our priority.  As part of our continuing mission to provide you with exceptional heart care, we have created designated Provider Care Teams.  These Care Teams include your primary Cardiologist (physician) and Advanced Practice Providers (APPs -  Physician Assistants and Nurse Practitioners) who all work together to provide you with the care you need, when you need it.  We recommend signing up for the patient portal called "MyChart".  Sign up information is provided on this After Visit Summary.  MyChart is used to connect with patients for Virtual Visits (Telemedicine).  Patients are able to view  lab/test results, encounter notes, upcoming appointments, etc.  Non-urgent messages can be sent to your provider as well.   To learn more about what you can do with MyChart, go to [email or web address].    Your next appointment:   As needed  The format for your next appointment:   In Person  Provider:   You may see Dr. Verdis Prime or one of the following Advanced Practice Providers on your designated Care Team:    Norma Fredrickson, NP  Nada Boozer, NP  Georgie Chard, NP    Other Instructions  [employer] Cardiac Event Monitor Instructions Your physician has requested you wear your cardiac event monitor for 30 days. [employer] may call or text to confirm a shipping address. The monitor will be sent to a land address via UPS. [employer] will not ship a monitor to a PO BOX. It typically takes 3-5 days to receive your monitor after it has been enrolled. [employer] will assist with USPS tracking if your package is delayed. The telephone number for [employer] is [phone]. Once you have received your monitor, please review the enclosed instructions. Instruction tutorials can also be viewed under help and settings on the enclosed cell phone. Your monitor has already been registered assigning a specific monitor serial # to you.  Applying the monitor Remove cell phone from case and turn it on. The cell phone works as IT consultant and needs to be within [employer] of you at all times. The cell phone will need to be charged on a daily basis. We recommend you plug the cell phone into the enclosed charger at your bedside table every night.  Monitor batteries: You will receive two monitor batteries labelled #1 and #2. These are your recorders. Plug battery #2 onto the second connection on the enclosed charger. Keep one battery on the charger at all times. This will keep the monitor battery deactivated. It will also keep it fully charged for when you need to switch your monitor  batteries. A small light will be blinking on the battery emblem when it is charging. The light on the battery emblem will remain on when the battery is fully charged.  Open package of a Monitor strip. Insert battery #1 into black hood on strip and gently squeeze monitor battery onto connection as indicated in instruction booklet. Set aside while preparing skin.  Choose location for your strip, vertical or horizontal, as indicated in the instruction booklet. Shave to remove all hair from location. There cannot be any lotions, oils, powders, or colognes on skin where monitor is to be applied. Wipe skin clean with enclosed Saline wipe. Dry skin completely.  Peel paper labeled #1 off the back of the Monitor strip exposing the adhesive. Place  the monitor on the chest in the vertical or horizontal position shown in the instruction booklet. One arrow on the monitor strip must be pointing upward. Carefully remove paper labeled #2, attaching remainder of strip to your skin. Try not to create any folds or wrinkles in the strip as you apply it.  Firmly press and release the circle in the center of the monitor battery. You will hear a small beep. This is turning the monitor battery on. The heart emblem on the monitor battery will light up every 5 seconds if the monitor battery in turned on and connected to the patient securely. Do not push and hold the circle down as this turns the monitor battery off. The cell phone will locate the monitor battery. A screen will appear on the cell phone checking the connection of your monitor strip. This may read poor connection initially but change to good connection within the next minute. Once your monitor accepts the connection you will hear a series of 3 beeps followed by a climbing crescendo of beeps. A screen will appear on the cell phone showing the two monitor strip placement options. Touch the picture that demonstrates where you applied the monitor  strip.  Your monitor strip and battery are waterproof. You are able to shower, bathe, or swim with the monitor on. They just ask you do not submerge deeper than 3 feet underwater. We recommend removing the monitor if you are swimming in a lake, river, or ocean.  Your monitor battery will need to be switched to a fully charged monitor battery approximately once a week. The cell phone will alert you of an action which needs to be made.  On the cell phone, tap for details to reveal connection status, monitor battery status, and cell phone battery status. The green dots indicates your monitor is in good status. A red dot indicates there is something that needs your attention.  To record a symptom, click the circle on the monitor battery. In 30-60 seconds a list of symptoms will appear on the cell phone. Select your symptom and tap save. Your monitor will record a sustained or significant arrhythmia regardless of you clicking the button. Some patients do not feel the heart rhythm irregularities. [employer] will notify us of any serious or critical events.  Refer to instruction booklet for instructions on switching batteries, changing strips, the Do not disturb or Pause features, or any additional questions.  Call [employer] at [phone], to confirm your monitor is transmitting and record your baseline. They will answer any questions you may have regarding the monitor instructions at that time.  Returning the monitor to [employer] Place all equipment back into blue box. Peel off strip of paper to expose adhesive and close box securely. There is a prepaid UPS shipping label on this box. Drop in a UPS drop box, or at a UPS facility like [employer]. You may also contact [employer] to arrange UPS to pick up monitor package at your home.      Signed, Lesleigh Noe, MD  06/07/2020 11:05 AM    [facility]

## 2020-06-07 NOTE — Telephone Encounter (Signed)
Enrolled patient for a 30 day [employer] Event Monitor to be mailed to patients home  

## 2020-06-09 LAB — SARS-COV-2 RNA,(COVID-19) QUALITATIVE NAAT: SARS CoV2 RNA: DETECTED — AB

## 2020-06-10 ENCOUNTER — Ambulatory Visit: Payer: 59 | Admitting: Family

## 2020-06-11 ENCOUNTER — Other Ambulatory Visit: Payer: Self-pay

## 2020-06-11 ENCOUNTER — Telehealth (INDEPENDENT_AMBULATORY_CARE_PROVIDER_SITE_OTHER): Payer: 59 | Admitting: Family

## 2020-06-11 ENCOUNTER — Telehealth: Payer: Self-pay

## 2020-06-11 ENCOUNTER — Ambulatory Visit (INDEPENDENT_AMBULATORY_CARE_PROVIDER_SITE_OTHER): Payer: 59

## 2020-06-11 ENCOUNTER — Encounter: Payer: Self-pay | Admitting: Family

## 2020-06-11 VITALS — Ht 67.0 in | Wt 234.2 lb

## 2020-06-11 DIAGNOSIS — R002 Palpitations: Secondary | ICD-10-CM

## 2020-06-11 DIAGNOSIS — U071 COVID-19: Secondary | ICD-10-CM

## 2020-06-11 NOTE — Patient Instructions (Signed)
-   continue on Vitamin C ,Vitamin D and Zinc supplement - continue to follow up with Cardiologist as directed - Notify provider or go to ED if symptoms worsen or fail to improve

## 2020-06-11 NOTE — Telephone Encounter (Signed)
Ms. Frances Rodriguez, Frances Rodriguez are scheduled for a virtual visit with your provider today.    Just as we do with appointments in the office, we must obtain your consent to participate.  Your consent will be active for this visit and any virtual visit you may have with one of our providers in the next 365 days.    If you have a MyChart account, I can also send a copy of this consent to you electronically.  All virtual visits are billed to your insurance company just like a traditional visit in the office.  As this is a virtual visit, video technology does not allow for your provider to perform a traditional examination.  This may limit your provider's ability to fully assess your condition.  If your provider identifies any concerns that need to be evaluated in person or the need to arrange testing such as labs, EKG, etc, we will make arrangements to do so.    Although advances in technology are sophisticated, we cannot ensure that it will always work on either your end or our end.  If the connection with a video visit is poor, we may have to switch to a telephone visit.  With either a video or telephone visit, we are not always able to ensure that we have a secure connection.   I need to obtain your verbal consent now.   Are you willing to proceed with your visit today?   Frances Rodriguez has provided verbal consent on 06/11/2020 for a virtual visit (video or telephone).   Franchot Erichsen, CMA 06/11/2020  1:21 PM

## 2020-06-11 NOTE — Progress Notes (Signed)
This service is provided via telemedicine  No vital signs collected/recorded due to the encounter was a telemedicine visit.   Location of patient (ex: home, work):  Home  Patient consents to a telephone visit:  Yes  Location of the provider (ex: office, home):  [facility] office  Name of any referring provider:  Richarda Blade, NP  Names of all persons participating in the telemedicine service and their role in the encounter:  Richarda Blade, NP; Clarisse Gouge, CMA; patient.  Time spent on call:  8 minutes CMA time   Location:      Place of Service:    Provider: Audwin Semper FNP-C  Juliann Olesky, Donalee Citrin, NP  Patient Care Team: Brentyn Seehafer, Donalee Citrin, NP as PCP - General (Family Medicine) Jaclyn Shaggy, MD (Endocrinology)  Extended Emergency Contact Information Primary Emergency Contact: Hosie Poisson Home Phone: [phone] Work Phone: [phone] Relation: Niece  Code Status:  DNR Goals of care: Advanced Directive information Advanced Directives 06/03/2020  Does Patient Have a Medical Advance Directive? No  Would patient like information on creating a medical advance directive? No - Patient declined     Chief Complaint  Patient presents with  . Acute Visit    Followup, status post COVID    HPI:  Pt is a 60 y.o. female seen today for an acute visit for one week follow up palpitation and COVID-19 positive.she was here on 06/04/2019 with complains of palpitation and fatigue and shortness of breath with exertion.EKG done showed right aterial abnormality with HR 102 b/min.she was started on metoprolol tartrate 12.5 mg tablet twice daily and referred to Cardiologist.she was seen on 06/07/2020 by DR. Verdis Prime at [facility] TSH and Bnp,2D ECHO and a 4 week Heart monitor.  COVID-19 test done results were positive.Has been advised to take vit C,vit D and zinc supplement. She states feeling much better since starting on metoprolol has not had any palpitation.Still gets shortness of breath  sometimes on exertion. She denies any fever,chills,chest pain,chest tightness,nausea ,vomiting,diarrhea or loss of smell/taste.  States received heart monitor today will be charging then connect as directed by Cardiologist office.    Past Medical History:  Diagnosis Date  . Diabetes mellitus without complication [facility])    Past Surgical History:  Procedure Laterality Date  . BREAST BIOPSY Left    2013 benign   . COLONOSCOPY  15 years ago    in [facility] exam per pt  . GASTRIC BYPASS  2005  . TUBAL LIGATION      Allergies  Allergen Reactions  . Percocet [Oxycodone-Acetaminophen] Itching  . Tramadol Itching    Outpatient Encounter Medications as of 06/11/2020  Medication Sig  . acetaminophen (TYLENOL) 500 MG tablet Take 500 mg by mouth as needed.  Marland Kitchen ascorbic acid (VITAMIN C) 500 MG tablet Take 500 mg by mouth daily.  . Dulaglutide 1.5 MG/0.5ML SOPN Inject 1.5 mg into the skin once a week.  Marland Kitchen glipiZIDE (GLUCOTROL) 10 MG tablet Take 1 tablet (10 mg total) by mouth 2 (two) times daily before a meal.  . loratadine (CLARITIN) 10 MG tablet Take 1 tablet (10 mg total) by mouth daily.  . metFORMIN (GLUCOPHAGE) 1000 MG tablet Take 1 tablet (1,000 mg total) by mouth 2 (two) times daily with a meal.  . metoprolol tartrate (LOPRESSOR) 25 MG tablet Take 0.5 tablets (12.5 mg total) by mouth 2 (two) times daily.  Marland Kitchen OVER THE COUNTER MEDICATION Take 1 capsule by mouth 3 (three) times daily. Total Restore. Take medication with meals.  No facility-administered encounter medications on file as of 06/11/2020.    Review of Systems  Constitutional: Negative for appetite change, chills, fatigue and fever.  HENT: Negative for congestion, rhinorrhea, sinus pressure, sinus pain, sneezing and sore throat.   Respiratory: Negative for cough, chest tightness and wheezing.        Shortness of breath sometimes with exertion   Cardiovascular: Negative for chest pain, palpitations and leg swelling.   Gastrointestinal: Negative for abdominal distention, abdominal pain, constipation, diarrhea, nausea and vomiting.  Neurological: Negative for dizziness, speech difficulty, weakness, light-headedness, numbness and headaches.  Hematological: Does not bruise/bleed easily.    Immunization History  Administered Date(s) Administered  . PFIZER SARS-COV-2 Vaccination 08/14/2019, 09/05/2019, 04/20/2020  . Pneumococcal Polysaccharide-23 06/16/2019   Pertinent  Health Maintenance Due  Topic Date Due  . FOOT EXAM  Never done  . OPHTHALMOLOGY EXAM  Never done  . PAP SMEAR-Modifier  Never done  . URINE MICROALBUMIN  11/07/2019  . HEMOGLOBIN A1C  11/21/2019  . INFLUENZA VACCINE  08/29/2020 (Originally 12/31/2019)  . MAMMOGRAM  05/22/2021  . COLONOSCOPY (Pts 45-45yrs Insurance coverage will need to be confirmed)  07/17/2029   Fall Risk  06/11/2020 06/03/2020 06/16/2019  Falls in the past year? 0 0 0  Number falls in past yr: 0 0 0  Injury with Fall? - 0 0   Functional Status Survey:    Vitals:   06/11/20 1323  Weight: 234 lb 3.2 oz (106.2 kg)  Height: 5\' 7"  (1.702 m)   Body mass index is 36.68 kg/m. Physical Exam Vitals reviewed.  Constitutional:      General: She is not in acute distress.    Appearance: She is not ill-appearing.  Pulmonary:     Effort: Pulmonary effort is normal. No respiratory distress.  Neurological:     Mental Status: She is alert and oriented to person, place, and time.  Psychiatric:        Mood and Affect: Mood normal.        Behavior: Behavior normal.        Thought Content: Thought content normal.        Judgment: Judgment normal.    Labs reviewed: No results for input(s): NA, K, CL, CO2, GLUCOSE, BUN, CREATININE, CALCIUM, MG, PHOS in the last 8760 hours. No results for input(s): AST, ALT, ALKPHOS, BILITOT, PROT, ALBUMIN in the last 8760 hours. Recent Labs    06/03/20 1022  WBC 8.4  NEUTROABS 6,451  HGB 12.4  HCT 36.1  MCV 94.5  PLT 279   Lab  Results  Component Value Date   TSH 1.470 06/07/2020   Lab Results  Component Value Date   HGBA1C 7.7 05/23/2019   Lab Results  Component Value Date   CHOL 153 11/07/2018   HDL 102 (A) 11/07/2018   LDLCALC 34 11/07/2018   TRIG 86 11/07/2018    Significant Diagnostic Results in last 30 days:  No results found.  Assessment/Plan 1. Palpitation Metoprolol tartrate effective.No chest pain reported.possible multifactorial from symptoms of COVID-19 though EKG showed right  Arterial enlargement with HR 102 b/min.TSH and Bnp done by cardiologist are within normal range.  - Advised to wear Heart monitor as directed by Cardiologist.Follow up for 2 D ECHO as scheduled.  - Advised to notify provider or go to ED if symptoms worsen or develops any chest pain.   2. Lab test positive for detection of COVID-19 virus Tested positive for COVID-19 test done 06/04/2019.Reports feeling much better today except appetite still  not good.  - continue on Vitamin C,Vitamin D and Zinc supplement. - Advised to continue with self quarantine,social distancing,hand hygiene and wearing facial mask.  - increase  Fresh fruits in diet - Notify provider or go to ED for any worsening shortness of breath,dyspnea,chest tightness or chest pain  Family/ staff Communication: Reviewed plan of care with patient verbalized understanding.   Labs/tests ordered: None   Next Appointment: As needed if symptoms worsen or fail to improve.   I connected with  Berdell Bhullar on 06/11/20 by a video enabled telemedicine application and verified that I am speaking with the correct person using two identifiers.   I discussed the limitations of evaluation and management by telemedicine. The patient expressed understanding and agreed to proceed.  Spent 15 minutes of non-face to face with patient    Caesar Bookman, NP

## 2020-06-12 ENCOUNTER — Ambulatory Visit: Payer: No Typology Code available for payment source | Admitting: Internal Medicine

## 2020-06-12 LAB — POCT INFLUENZA A/B
Influenza A, POC: NEGATIVE
Influenza B, POC: NEGATIVE

## 2020-06-28 ENCOUNTER — Ambulatory Visit (HOSPITAL_COMMUNITY): Payer: 59 | Attending: Cardiology

## 2020-06-28 ENCOUNTER — Other Ambulatory Visit: Payer: Self-pay

## 2020-06-28 DIAGNOSIS — R0602 Shortness of breath: Secondary | ICD-10-CM

## 2020-06-28 LAB — ECHOCARDIOGRAM COMPLETE
Area-P 1/2: 4.8 cm2
S' Lateral: 2.7 cm

## 2020-07-04 ENCOUNTER — Other Ambulatory Visit: Payer: Self-pay

## 2020-07-04 ENCOUNTER — Encounter: Payer: Self-pay | Admitting: Family

## 2020-07-04 ENCOUNTER — Ambulatory Visit (INDEPENDENT_AMBULATORY_CARE_PROVIDER_SITE_OTHER): Payer: 59 | Admitting: Family

## 2020-07-04 VITALS — BP 118/80 | HR 88 | Temp 97.1°F | Resp 18 | Ht 67.0 in | Wt 237.0 lb

## 2020-07-04 DIAGNOSIS — E119 Type 2 diabetes mellitus without complications: Secondary | ICD-10-CM

## 2020-07-04 DIAGNOSIS — Z124 Encounter for screening for malignant neoplasm of cervix: Secondary | ICD-10-CM | POA: Diagnosis not present

## 2020-07-04 DIAGNOSIS — Z0000 Encounter for general adult medical examination without abnormal findings: Secondary | ICD-10-CM

## 2020-07-04 DIAGNOSIS — N950 Postmenopausal bleeding: Secondary | ICD-10-CM

## 2020-07-04 DIAGNOSIS — Z23 Encounter for immunization: Secondary | ICD-10-CM

## 2020-07-04 MED ORDER — TETANUS-DIPHTH-ACELL PERTUSSIS 5-2-15.5 LF-MCG/0.5 IM SUSP: 0.5000 mL | Freq: Once | INTRAMUSCULAR | 0 refills | Status: AC

## 2020-07-04 NOTE — Patient Instructions (Signed)
Health Maintenance, Female Adopting a healthy lifestyle and getting preventive care are important in promoting health and wellness. Ask your health care provider about:  The right schedule for you to have regular tests and exams.  Things you can do on your own to prevent diseases and keep yourself healthy. What should I know about diet, weight, and exercise? Eat a healthy diet  Eat a diet that includes plenty of vegetables, fruits, low-fat dairy products, and lean protein.  Do not eat a lot of foods that are high in solid fats, added sugars, or sodium.   Maintain a healthy weight Body mass index (BMI) is used to identify weight problems. It estimates body fat based on height and weight. Your health care provider can help determine your BMI and help you achieve or maintain a healthy weight. Get regular exercise Get regular exercise. This is one of the most important things you can do for your health. Most adults should:  Exercise for at least 150 minutes each week. The exercise should increase your heart rate and make you sweat (moderate-intensity exercise).  Do strengthening exercises at least twice a week. This is in addition to the moderate-intensity exercise.  Spend less time sitting. Even light physical activity can be beneficial. Watch cholesterol and blood lipids Have your blood tested for lipids and cholesterol at 61 years of age, then have this test every 5 years. Have your cholesterol levels checked more often if:  Your lipid or cholesterol levels are high.  You are older than 61 years of age.  You are at high risk for heart disease. What should I know about cancer screening? Depending on your health history and family history, you may need to have cancer screening at various ages. This may include screening for:  Breast cancer.  Cervical cancer.  Colorectal cancer.  Skin cancer.  Lung cancer. What should I know about heart disease, diabetes, and high blood  pressure? Blood pressure and heart disease  High blood pressure causes heart disease and increases the risk of stroke. This is more likely to develop in people who have high blood pressure readings, are of African descent, or are overweight.  Have your blood pressure checked: ? Every 3-5 years if you are 18-39 years of age. ? Every year if you are 40 years old or older. Diabetes Have regular diabetes screenings. This checks your fasting blood sugar level. Have the screening done:  Once every three years after age 40 if you are at a normal weight and have a low risk for diabetes.  More often and at a younger age if you are overweight or have a high risk for diabetes. What should I know about preventing infection? Hepatitis B If you have a higher risk for hepatitis B, you should be screened for this virus. Talk with your health care provider to find out if you are at risk for hepatitis B infection. Hepatitis C Testing is recommended for:  Everyone born from 1945 through 1965.  Anyone with known risk factors for hepatitis C. Sexually transmitted infections (STIs)  Get screened for STIs, including gonorrhea and chlamydia, if: ? You are sexually active and are younger than 61 years of age. ? You are older than 61 years of age and your health care provider tells you that you are at risk for this type of infection. ? Your sexual activity has changed since you were last screened, and you are at increased risk for chlamydia or gonorrhea. Ask your health care provider   if you are at risk.  Ask your health care provider about whether you are at high risk for HIV. Your health care provider may recommend a prescription medicine to help prevent HIV infection. If you choose to take medicine to prevent HIV, you should first get tested for HIV. You should then be tested every 3 months for as long as you are taking the medicine. Pregnancy  If you are about to stop having your period (premenopausal) and  you may become pregnant, seek counseling before you get pregnant.  Take 400 to 800 micrograms (mcg) of folic acid every day if you become pregnant.  Ask for birth control (contraception) if you want to prevent pregnancy. Osteoporosis and menopause Osteoporosis is a disease in which the bones lose minerals and strength with aging. This can result in bone fractures. If you are 65 years old or older, or if you are at risk for osteoporosis and fractures, ask your health care provider if you should:  Be screened for bone loss.  Take a calcium or vitamin D supplement to lower your risk of fractures.  Be given hormone replacement therapy (HRT) to treat symptoms of menopause. Follow these instructions at home: Lifestyle  Do not use any products that contain nicotine or tobacco, such as cigarettes, e-cigarettes, and chewing tobacco. If you need help quitting, ask your health care provider.  Do not use street drugs.  Do not share needles.  Ask your health care provider for help if you need support or information about quitting drugs. Alcohol use  Do not drink alcohol if: ? Your health care provider tells you not to drink. ? You are pregnant, may be pregnant, or are planning to become pregnant.  If you drink alcohol: ? Limit how much you use to 0-1 drink a day. ? Limit intake if you are breastfeeding.  Be aware of how much alcohol is in your drink. In the [place] one drink equals one 12 oz bottle of beer (355 mL), one 5 oz glass of wine (148 mL), or one 1 oz glass of hard liquor (44 mL). General instructions  Schedule regular health, dental, and eye exams.  Stay current with your vaccines.  Tell your health care provider if: ? You often feel depressed. ? You have ever been abused or do not feel safe at home. Summary  Adopting a healthy lifestyle and getting preventive care are important in promoting health and wellness.  Follow your health care provider's instructions about healthy  diet, exercising, and getting tested or screened for diseases.  Follow your health care provider's instructions on monitoring your cholesterol and blood pressure. This information is not intended to replace advice given to you by your health care provider. Make sure you discuss any questions you have with your health care provider. Document Revised: 05/11/2018 Document Reviewed: 05/11/2018 Elsevier Patient Education  2021 [employer]  

## 2020-07-04 NOTE — Progress Notes (Signed)
Provider: Richarda Blade FNP-C   Vennie Salsbury, Donalee Citrin, NP  Patient Care Team: Durenda Pechacek, Donalee Citrin, NP as PCP - General (Family Medicine) Jaclyn Shaggy, MD (Endocrinology)  Extended Emergency Contact Information Primary Emergency Contact: Hosie Poisson Home Phone: [phone] Work Phone: [phone] Relation: Niece  Code Status:  Full Code  Goals of care: Advanced Directive information Advanced Directives 07/04/2020  Does Patient Have a Medical Advance Directive? No  Would patient like information on creating a medical advance directive? No - Patient declined     Chief Complaint  Patient presents with  . Annual Exam    Physical Exam.  . Health Maintenance    Discuss the need for Pap Smear, Hemoglobin A1C, Eye Exam, Foot Exam, and Urine Microalbumin.    HPI:  Pt is a 60 y.o. female seen today for annual Physical Exam and medical management of chronic diseases.Also due for pap smear.she due for annual eye, foot exam and urine micro Albumin. She states has had some vaginal bleeding during sexual intercourse.has used some gel but still bleeding.she describes bleeding as spotting.  States will call opthalmology to schedule appointment.        Past Medical History:  Diagnosis Date  . Diabetes mellitus without complication [facility])    Past Surgical History:  Procedure Laterality Date  . BREAST BIOPSY Left    2013 benign   . COLONOSCOPY  15 years ago    [facility] pt  . GASTRIC BYPASS  2005  . TUBAL LIGATION      Allergies  Allergen Reactions  . Percocet [Oxycodone-Acetaminophen] Itching  . Tramadol Itching    Allergies as of 07/04/2020      Reactions   Percocet [oxycodone-acetaminophen] Itching   Tramadol Itching      Medication List       Accurate as of July 04, 2020 11:52 AM. If you have any questions, ask your nurse or doctor.        acetaminophen 500 MG tablet Commonly known as: TYLENOL Take 500 mg by mouth as needed.   ascorbic acid 500 MG  tablet Commonly known as: VITAMIN C Take 500 mg by mouth daily.   Dulaglutide 1.5 MG/0.5ML Sopn Inject 1.5 mg into the skin once a week.   glipiZIDE 10 MG tablet Commonly known as: GLUCOTROL Take 1 tablet (10 mg total) by mouth 2 (two) times daily before a meal.   loratadine 10 MG tablet Commonly known as: CLARITIN Take 1 tablet (10 mg total) by mouth daily.   metFORMIN 1000 MG tablet Commonly known as: GLUCOPHAGE Take 1 tablet (1,000 mg total) by mouth 2 (two) times daily with a meal.   metoprolol tartrate 25 MG tablet Commonly known as: LOPRESSOR Take 0.5 tablets (12.5 mg total) by mouth 2 (two) times daily.   OVER THE COUNTER MEDICATION Take 1 capsule by mouth 3 (three) times daily. Total Restore. Take medication with meals.       Review of Systems  Constitutional: Negative for appetite change, chills, fatigue and fever.  HENT: Negative for congestion, rhinorrhea, sinus pressure, sinus pain, sneezing, sore throat and trouble swallowing.   Eyes: Negative for discharge, redness, itching and visual disturbance.  Respiratory: Negative for cough, chest tightness, shortness of breath and wheezing.   Cardiovascular: Negative for chest pain, palpitations and leg swelling.  Gastrointestinal: Negative for abdominal distention, abdominal pain, constipation, diarrhea, nausea and vomiting.  Endocrine: Negative for cold intolerance, heat intolerance, polydipsia, polyphagia and polyuria.  Genitourinary: Negative for difficulty urinating, dysuria,  flank pain, frequency and urgency.  Musculoskeletal: Negative for arthralgias, back pain, gait problem, myalgias and neck pain.  Skin: Negative for color change, pallor and rash.  Neurological: Negative for dizziness, speech difficulty, weakness, light-headedness, numbness and headaches.  Hematological: Does not bruise/bleed easily.  Psychiatric/Behavioral: Negative for agitation, confusion and sleep disturbance. The patient is not  nervous/anxious.     Immunization History  Administered Date(s) Administered  . PFIZER(Purple Top)SARS-COV-2 Vaccination 08/14/2019, 09/05/2019, 04/20/2020  . Pneumococcal Polysaccharide-23 06/16/2019   Pertinent  Health Maintenance Due  Topic Date Due  . FOOT EXAM  Never done  . OPHTHALMOLOGY EXAM  Never done  . PAP SMEAR-Modifier  Never done  . URINE MICROALBUMIN  11/07/2019  . HEMOGLOBIN A1C  11/21/2019  . INFLUENZA VACCINE  08/29/2020 (Originally 12/31/2019)  . MAMMOGRAM  05/22/2021  . COLONOSCOPY (Pts 45-71yrs Insurance coverage will need to be confirmed)  07/17/2029   Fall Risk  07/04/2020 06/11/2020 06/03/2020 06/16/2019  Falls in the past year? 0 0 0 0  Number falls in past yr: 0 0 0 0  Injury with Fall? 0 - 0 0   Functional Status Survey:    Vitals:   07/04/20 1110  BP: 118/80  Pulse: 88  Resp: 18  Temp: (!) 97.1 F (36.2 C)  SpO2: 98%  Weight: 237 lb (107.5 kg)  Height: 5\' 7"  (1.702 m)   Body mass index is 37.12 kg/m. Physical Exam Vitals reviewed. Exam conducted with a chaperone present (Dillard [place] ).  Constitutional:      General: She is not in acute distress.    Appearance: She is not ill-appearing.  HENT:     Head: Normocephalic.     Right Ear: Tympanic membrane, ear canal and external ear normal. There is no impacted cerumen.     Left Ear: Tympanic membrane, ear canal and external ear normal. There is no impacted cerumen.     Nose: Nose normal. No congestion or rhinorrhea.     Mouth/Throat:     Mouth: Mucous membranes are moist.     Pharynx: Oropharynx is clear. No oropharyngeal exudate or posterior oropharyngeal erythema.  Eyes:     General: No scleral icterus.       Right eye: No discharge.        Left eye: No discharge.     Extraocular Movements: Extraocular movements intact.     Conjunctiva/sclera: Conjunctivae normal.     Pupils: Pupils are equal, round, and reactive to light.  Neck:     Vascular: No carotid bruit.  Cardiovascular:      Rate and Rhythm: Normal rate and regular rhythm.     Pulses: Normal pulses.     Heart sounds: Normal heart sounds. No murmur heard. No friction rub. No gallop.   Pulmonary:     Effort: Pulmonary effort is normal. No respiratory distress.     Breath sounds: Normal breath sounds. No wheezing, rhonchi or rales.  Chest:     Chest wall: No tenderness.  Abdominal:     General: Bowel sounds are normal. There is no distension.     Palpations: Abdomen is soft. There is no mass.     Tenderness: There is no abdominal tenderness. There is no right CVA tenderness, left CVA tenderness, guarding or rebound.  Genitourinary:    General: Normal vulva.     Exam position: Lithotomy position.     Labia:        Right: No rash, tenderness or lesion.  Left: No rash, tenderness or lesion.      Vagina: Normal.     Cervix: Normal.     Uterus: Normal.      Adnexa: Right adnexa normal and left adnexa normal.  Musculoskeletal:        General: No swelling or tenderness. Normal range of motion.     Cervical back: Normal range of motion. No rigidity or tenderness.     Right lower leg: No edema.     Left lower leg: No edema.  Lymphadenopathy:     Cervical: No cervical adenopathy.  Skin:    General: Skin is warm and dry.     Coloration: Skin is not pale.     Findings: No bruising, erythema or rash.  Neurological:     Mental Status: She is alert and oriented to person, place, and time.     Cranial Nerves: No cranial nerve deficit.     Sensory: No sensory deficit.     Motor: No weakness.     Coordination: Coordination normal.     Gait: Gait normal.  Psychiatric:        Mood and Affect: Mood normal.        Behavior: Behavior normal.        Thought Content: Thought content normal.        Judgment: Judgment normal.     Labs reviewed: No results for input(s): NA, K, CL, CO2, GLUCOSE, BUN, CREATININE, CALCIUM, MG, PHOS in the last 8760 hours. No results for input(s): AST, ALT, ALKPHOS, BILITOT,  PROT, ALBUMIN in the last 8760 hours. Recent Labs    06/03/20 1022  WBC 8.4  NEUTROABS 6,451  HGB 12.4  HCT 36.1  MCV 94.5  PLT 279   Lab Results  Component Value Date   TSH 1.470 06/07/2020   Lab Results  Component Value Date   HGBA1C 7.7 05/23/2019   Lab Results  Component Value Date   CHOL 153 11/07/2018   HDL 102 (A) 11/07/2018   LDLCALC 34 11/07/2018   TRIG 86 11/07/2018    Significant Diagnostic Results in last 30 days:  ECHOCARDIOGRAM COMPLETE  Result Date: 06/28/2020    ECHOCARDIOGRAM REPORT   Patient Name:   SHAREECE BULTMAN Date of Exam: 06/28/2020 Medical Rec #:  [id number]        Height:       67.0 in Accession #:    [id number]       Weight:       234.2 lb Date of Birth:  08/08/1960         BSA:          2.163 m Patient Age:    59 years         BP:           108/68 mmHg Patient Gender: F                HR:           83 bpm. Exam Location:  [address] Procedure: 2D Echo, Cardiac Doppler and Color Doppler Indications:    R06.00 Dyspnea  History:        Patient has no prior history of Echocardiogram examinations.                 Risk Factors:Diabetes and Obesity.  Sonographer:    Daphine Deutscher RDCS Referring Phys: [id number] [facility] IMPRESSIONS  1. Left ventricular ejection fraction, by estimation, is 60 to 65%. The left ventricle has normal  function. The left ventricle has no regional wall motion abnormalities. Left ventricular diastolic parameters are consistent with Grade I diastolic dysfunction (impaired relaxation).  2. Right ventricular systolic function is normal. The right ventricular size is normal.  3. The mitral valve is normal in structure. Trivial mitral valve regurgitation. No evidence of mitral stenosis.  4. The aortic valve is normal in structure. Aortic valve regurgitation is not visualized. No aortic stenosis is present.  5. The inferior vena cava is normal in size with greater than 50% respiratory variability, suggesting right atrial pressure of 3  mmHg. FINDINGS  Left Ventricle: Left ventricular ejection fraction, by estimation, is 60 to 65%. The left ventricle has normal function. The left ventricle has no regional wall motion abnormalities. The left ventricular internal cavity size was normal in size. There is  no left ventricular hypertrophy. Left ventricular diastolic parameters are consistent with Grade I diastolic dysfunction (impaired relaxation). Right Ventricle: The right ventricular size is normal. No increase in right ventricular wall thickness. Right ventricular systolic function is normal. Left Atrium: Left atrial size was normal in size. Right Atrium: Right atrial size was normal in size. Pericardium: There is no evidence of pericardial effusion. Mitral Valve: The mitral valve is normal in structure. Trivial mitral valve regurgitation. No evidence of mitral valve stenosis. Tricuspid Valve: The tricuspid valve is normal in structure. Tricuspid valve regurgitation is not demonstrated. No evidence of tricuspid stenosis. Aortic Valve: The aortic valve is normal in structure. Aortic valve regurgitation is not visualized. No aortic stenosis is present. Pulmonic Valve: The pulmonic valve was normal in structure. Pulmonic valve regurgitation is not visualized. No evidence of pulmonic stenosis. Aorta: The aortic root is normal in size and structure. Venous: The inferior vena cava is normal in size with greater than 50% respiratory variability, suggesting right atrial pressure of 3 mmHg. IAS/Shunts: No atrial level shunt detected by color flow Doppler.  LEFT VENTRICLE PLAX 2D LVIDd:         4.40 cm  Diastology LVIDs:         2.70 cm  LV e' medial:    8.16 cm/s LV PW:         1.00 cm  LV E/e' medial:  9.4 LV IVS:        0.90 cm  LV e' lateral:   8.59 cm/s LVOT diam:     2.10 cm  LV E/e' lateral: 9.0 LV SV:         67 LV SV Index:   31 LVOT Area:     3.46 cm  RIGHT VENTRICLE             IVC RV Basal diam:  3.70 cm     IVC diam: 1.50 cm RV S prime:      12.70 cm/s TAPSE (M-mode): 1.9 cm LEFT ATRIUM             Index       RIGHT ATRIUM           Index LA diam:        3.70 cm 1.71 cm/m  RA Area:     11.00 cm LA Vol (A2C):   44.4 ml 20.53 ml/m RA Volume:   25.70 ml  11.88 ml/m LA Vol (A4C):   33.5 ml 15.49 ml/m LA Biplane Vol: 40.1 ml 18.54 ml/m  AORTIC VALVE LVOT Vmax:   81.30 cm/s LVOT Vmean:  58.400 cm/s LVOT VTI:    0.194 m  AORTA Ao Root diam: 3.20  cm Ao Asc diam:  3.30 cm MITRAL VALVE               TRICUSPID VALVE MV Area (PHT): 4.80 cm    TR Peak grad:   18.8 mmHg MV Decel Time: 158 msec    TR Vmax:        217.00 cm/s MV E velocity: 77.10 cm/s MV A velocity: 81.00 cm/s  SHUNTS MV E/A ratio:  0.95        Systemic VTI:  0.19 m                            Systemic Diam: 2.10 cm Donato Schultz MD Electronically signed by Donato Schultz MD Signature Date/Time: 06/28/2020/12:32:23 PM    Final     Assessment/Plan 1. Annual physical exam Up to date with immunization except Tdap.Script send to pharmacy.Medication and labs reviewed patient counselled regarding yearly exam, prevention of dental and periodontal disease, diet, regular sustained exercise for at least 30 minutes x 3 /week,COVID-19 hand hygiene, mask and social distancing per CDC guidelines. recommended schedule for routine labs. Fall screening.   2. Type 2 diabetes mellitus without complication, without long-term current use of insulin (HCC) Lab Results  Component Value Date   HGBA1C 7.7 05/23/2019  - continue on metformin ,glipizide and dulaglutide  - Ambulatory referral to Podiatry - Microalbumin / creatinine urine ratio  3. Need for Tdap vaccination Advised to get Tdap vaccine at her pharmacy. Script send to pharmacy.  - Tdap (ADACEL) 09-30-13.5 LF-MCG/0.5 injection; Inject 0.5 mLs into the muscle once for 1 dose.  Dispense: 0.5 mL; Refill: 0  4. Cervical cancer screening Pelvic exam normal.No bleeding noted.  - PAP, Image Guided [[facility]]  5. Postmenopausal vaginal  bleeding Reports spotting with sexual activity.Negative exam finding.will await Pap smear then send to Gyn for further evaluation.   Family/ staff Communication: Reviewed plan of care with patient  Labs/tests ordered:   - PAP, Image Guided [[facility]]  Next Appointment : one year for Annual Physical Examination   Caesar Bookman, NP

## 2020-07-05 ENCOUNTER — Telehealth: Payer: Self-pay

## 2020-07-05 ENCOUNTER — Other Ambulatory Visit: Payer: Self-pay

## 2020-07-05 LAB — MICROALBUMIN / CREATININE URINE RATIO
Creatinine, Urine: 238 mg/dL (ref 20–275)
Microalb Creat Ratio: 5 mcg/mg creat (ref <30)
Microalb, Ur: 1.1 mg/dL

## 2020-07-05 MED ORDER — LISINOPRIL 5 MG PO TABS: 5.0000 mg | ORAL_TABLET | Freq: Every day | ORAL | 1 refills | Status: DC

## 2020-07-05 NOTE — Progress Notes (Unsigned)
This encounter was created in error - please disregard.

## 2020-07-05 NOTE — Telephone Encounter (Signed)
Medication sent to pharmacy  

## 2020-07-08 ENCOUNTER — Telehealth: Payer: Self-pay | Admitting: *Deleted

## 2020-07-08 LAB — PAP IG (IMAGE GUIDED)

## 2020-07-08 NOTE — Telephone Encounter (Signed)
Latest labs done were not fasting when you were diagnosed with COVID-19.Need fasting lab work which insurance should be able to cover since you've not had any fasting lab work this year.

## 2020-07-08 NOTE — Telephone Encounter (Signed)
Patient called and stated that she has fasting labs scheduled for 2/8. Stated that she has already had labs done and her insurance probably will not cover.  Stated that she has already had an Hgb A1C the beginning of January here at our office.  Our records indicate the last one was 05/23/2019. Patient stated that was not true she just had it done.   I have Hemoglobin A1C, Lipid, TSH and CMP listed to draw on 07/09/2020. Patient stated she already had this done at our office.  I told her the only thing I see that was recently drawn out of those was TSH by Dr. Verdis Prime.  She stated that she had the others already done here to. I tried to explain to her that we didn't have these test and she wanted a message sent to you regarding this.  Please Advise.

## 2020-07-08 NOTE — Telephone Encounter (Signed)
Patient notified.  Rescheduled lab appointment to 07/10/2020

## 2020-07-09 ENCOUNTER — Other Ambulatory Visit: Payer: Self-pay | Admitting: Family

## 2020-07-09 ENCOUNTER — Other Ambulatory Visit: Payer: 59

## 2020-07-09 DIAGNOSIS — N950 Postmenopausal bleeding: Secondary | ICD-10-CM

## 2020-07-10 ENCOUNTER — Other Ambulatory Visit: Payer: 59

## 2020-07-10 ENCOUNTER — Other Ambulatory Visit: Payer: Self-pay

## 2020-07-11 LAB — COMPLETE METABOLIC PANEL WITH GFR
AG Ratio: 1.2 (calc) (ref 1.0–2.5)
ALT: 22 U/L (ref 6–29)
AST: 24 U/L (ref 10–35)
Albumin: 3.9 g/dL (ref 3.6–5.1)
Alkaline phosphatase (APISO): 72 U/L (ref 37–153)
BUN: 14 mg/dL (ref 7–25)
CO2: 27 mmol/L (ref 20–32)
Calcium: 9.3 mg/dL (ref 8.6–10.4)
Chloride: 101 mmol/L (ref 98–110)
Creat: 0.81 mg/dL (ref 0.50–0.99)
GFR, Est African American: 91 mL/min/{1.73_m2} (ref >=60)
GFR, Est Non African American: 79 mL/min/{1.73_m2} (ref >=60)
Globulin: 3.2 g/dL (calc) (ref 1.9–3.7)
Glucose, Bld: 176 mg/dL — ABNORMAL HIGH (ref 65–99)
Potassium: 4.6 mmol/L (ref 3.5–5.3)
Sodium: 137 mmol/L (ref 135–146)
Total Bilirubin: 0.7 mg/dL (ref 0.2–1.2)
Total Protein: 7.1 g/dL (ref 6.1–8.1)

## 2020-07-11 LAB — HEMOGLOBIN A1C
Hgb A1c MFr Bld: 8.5 % of total Hgb — ABNORMAL HIGH (ref <5.7)
Mean Plasma Glucose: 197 mg/dL
eAG (mmol/L): 10.9 mmol/L

## 2020-07-11 LAB — LIPID PANEL
Cholesterol: 175 mg/dL (ref <200)
HDL: 106 mg/dL (ref >=50)
LDL Cholesterol (Calc): 52 mg/dL (calc)
Non-HDL Cholesterol (Calc): 69 mg/dL (calc) (ref <130)
Total CHOL/HDL Ratio: 1.7 (calc) (ref <5.0)
Triglycerides: 90 mg/dL (ref <150)

## 2020-07-15 ENCOUNTER — Other Ambulatory Visit: Payer: Self-pay | Admitting: Family

## 2020-07-15 DIAGNOSIS — Z1231 Encounter for screening mammogram for malignant neoplasm of breast: Secondary | ICD-10-CM

## 2020-07-18 ENCOUNTER — Ambulatory Visit: Payer: 59 | Admitting: Podiatry

## 2020-07-23 ENCOUNTER — Telehealth: Payer: Self-pay

## 2020-07-23 DIAGNOSIS — I152 Hypertension secondary to endocrine disorders: Secondary | ICD-10-CM

## 2020-07-23 DIAGNOSIS — E1159 Type 2 diabetes mellitus with other circulatory complications: Secondary | ICD-10-CM

## 2020-07-23 DIAGNOSIS — I10 Essential (primary) hypertension: Secondary | ICD-10-CM

## 2020-07-23 DIAGNOSIS — E119 Type 2 diabetes mellitus without complications: Secondary | ICD-10-CM

## 2020-07-23 MED ORDER — DULAGLUTIDE 3 MG/0.5ML ~~LOC~~ SOAJ: 3.0000 mg | SUBCUTANEOUS | 5 refills | Status: DC

## 2020-07-23 NOTE — Telephone Encounter (Signed)
Discussed results with patient, patient verbalized understanding of results  Patient in agreement with medication change. New RX sent to pharmacy on file (confirmed)   4 month follow-up scheduled for June 2022  Dinah please clarify if patient to have labs prior to follow-up and if so which labs would you like rechecked

## 2020-07-23 NOTE — Telephone Encounter (Signed)
-----   Message from Caesar Bookman, NP sent at 07/23/2020  3:01 PM EST ----- - Electrolytes,kidney,liver function,cholesterol level are all within normal range. - glucose and hemoglobin A1C are high.Recommend increasing Dulaglutide from 1.5 mg /0.5 ml to 3 mg/0.5 ml inject into SQ once per week.Also low carbohydrates,low saturated fats and high vegetable diet. And exercise at least three times per week for 30 minutes.Then follow up in 4 months for Type 2 DM and recheck Fasting labs.

## 2020-07-24 NOTE — Telephone Encounter (Signed)
Spoke with patient and scheduled appointment a few days prior to have fasting labs rechecked.

## 2020-07-24 NOTE — Telephone Encounter (Signed)
Fasting labs prior to visit.CBC/diff,BMP,Hgb A1C and Lipid panel.

## 2020-07-25 ENCOUNTER — Encounter: Payer: Self-pay | Admitting: Nurse Practitioner

## 2020-07-25 ENCOUNTER — Ambulatory Visit (INDEPENDENT_AMBULATORY_CARE_PROVIDER_SITE_OTHER): Payer: 59 | Admitting: Nurse Practitioner

## 2020-07-25 ENCOUNTER — Other Ambulatory Visit: Payer: Self-pay

## 2020-07-25 VITALS — BP 136/78 | HR 98 | Resp 14 | Ht 67.0 in | Wt 229.0 lb

## 2020-07-25 DIAGNOSIS — N941 Unspecified dyspareunia: Secondary | ICD-10-CM | POA: Diagnosis not present

## 2020-07-25 DIAGNOSIS — N930 Postcoital and contact bleeding: Secondary | ICD-10-CM | POA: Diagnosis not present

## 2020-07-25 DIAGNOSIS — N950 Postmenopausal bleeding: Secondary | ICD-10-CM | POA: Diagnosis not present

## 2020-07-25 NOTE — Progress Notes (Signed)
   Acute Office Visit  Subjective:    Patient ID: Frances Rodriguez, female    DOB: 02/18/1961, 60 y.o.   MRN: [id number]   HPI 60 y.o. presents as new patient for dyspareunia and bleeding after intercourse that first started around a year ago. It does not occur each time, feels deeper within the vaginal canal, is very light and mostly with wiping. Postmenopausal - no HRT. Normal pap history, most recent 07/04/2020. Sexually active with same partner x 5 years. Uses lubricant as needed for vaginal dryness. Mother and sister with history of breast cancer, maternal aunt with history of ovarian cancer.   Review of Systems  Constitutional: Negative.   Gastrointestinal: Negative.   Genitourinary: Positive for dyspareunia and vaginal bleeding. Negative for vaginal discharge.       Objective:    Physical Exam Constitutional:      Appearance: Normal appearance.  Abdominal:     Tenderness: There is no abdominal tenderness.  Genitourinary:    General: Normal vulva.     Vagina: Normal.     Cervix: Normal.     Comments: Uterus difficult to palpate due to body habitus but no gross masses or tenderness    BP 136/78 (BP Location: Right Arm, Patient Position: Sitting, Cuff Size: Large)   Pulse 98   Resp 14   Ht 5\' 7"  (1.702 m)   Wt 229 lb (103.9 kg)   BMI 35.87 kg/m  Wt Readings from Last 3 Encounters:  07/25/20 229 lb (103.9 kg)  07/04/20 237 lb (107.5 kg)  06/11/20 234 lb 3.2 oz (106.2 kg)        Assessment & Plan:   Problem List Items Addressed This Visit   None   Visit Diagnoses    Postmenopausal bleeding    -  Primary   Relevant Orders   08/09/20 PELVIS TRANSVAGINAL NON-OB (TV ONLY)   Postcoital bleeding       Relevant Orders   SURESWAB CT/NG/T. vaginalis   US PELVIS TRANSVAGINAL NON-OB (TV ONLY)   Dyspareunia in female       Relevant Orders   SURESWAB CT/NG/T. vaginalis     Plan: Pelvic exam unremarkable, no evidence of active bleeding, lesions, or erythema.  GC/chlamydia/Trich pending. We will schedule pelvic ultrasound to rule out uterine abnormalities. If all is normal, bleeding is likely a result of vaginal dryness and we can discuss management options for this at that time. She is agreeable to plan.      [place] [facility], 10:04 AM 07/25/2020

## 2020-07-26 LAB — SURESWAB CT/NG/T. VAGINALIS
C. trachomatis RNA, TMA: NOT DETECTED
N. gonorrhoeae RNA, TMA: NOT DETECTED
Trichomonas vaginalis RNA: NOT DETECTED

## 2020-07-29 ENCOUNTER — Other Ambulatory Visit: Payer: Self-pay

## 2020-07-29 ENCOUNTER — Ambulatory Visit (INDEPENDENT_AMBULATORY_CARE_PROVIDER_SITE_OTHER): Payer: 59 | Admitting: Podiatry

## 2020-07-29 ENCOUNTER — Encounter: Payer: Self-pay | Admitting: Podiatry

## 2020-07-29 DIAGNOSIS — M19071 Primary osteoarthritis, right ankle and foot: Secondary | ICD-10-CM

## 2020-07-29 DIAGNOSIS — E119 Type 2 diabetes mellitus without complications: Secondary | ICD-10-CM

## 2020-07-29 DIAGNOSIS — S92504S Nondisplaced unspecified fracture of right lesser toe(s), sequela: Secondary | ICD-10-CM | POA: Diagnosis not present

## 2020-07-29 DIAGNOSIS — M2041 Other hammer toe(s) (acquired), right foot: Secondary | ICD-10-CM

## 2020-07-29 NOTE — Progress Notes (Signed)
  Subjective:  Patient ID: Frances Rodriguez, female    DOB: 12-02-1960,  MRN: [id number]  Chief Complaint  Patient presents with  . Diabetes    Diabetic foot exam.     60 y.o. female presents with the above complaint. History confirmed with patient.  She is referred to [place] from her primary care doctor for diabetic foot examination.  Her last A1c was 8.5%.  She said it usually is much lower than this but has recently increased.  She also complains of the stiff painful right fourth toe, she previously broke this many years ago and has never straighten out since then.  Objective:  Physical Exam: warm, good capillary refill, no trophic changes or ulcerative lesions, normal DP and PT pulses, normal monofilament exam and normal sensory exam.   Right Foot: Rigid hammertoe deformity at the fourth PIPJ of the right foot, dorsal hyperkeratosis   Assessment:   1. Type 2 diabetes mellitus without complication, without long-term current use of insulin (HCC)   2. Hammertoe of right foot   3. 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 Closed nondisplaced fracture of phalanx of lesser toe of right foot, unspecified phalanx, sequela      Plan:  Patient was evaluated and treated and all questions answered.  Patient educated on diabetes. Discussed proper diabetic foot care and discussed risks and complications of disease. Educated patient in depth on reasons to return to the office immediately should he/she discover anything concerning or new on the feet. All questions answered. Discussed proper shoes as well.   Discussed benefits of glycemic control and that she try to get her A1c back under 8%.   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  Discussed treatment options for this including surgical intervention of a revisional PIPJ fusion with K wire fixation.  We  discussed the risk, benefits and potential complications as well as the postoperative course.  She will consider this for the future.  I would want her A1c to be under 8% before we do this.  Return in about 1 year (around 07/29/2021) for diabetic foot exam.

## 2020-07-29 NOTE — Patient Instructions (Signed)
Diabetes Mellitus and Foot Care Foot care is an important part of your health, especially when you have diabetes. Diabetes may cause you to have problems because of poor blood flow (circulation) to your feet and legs, which can cause your skin to:  Become thinner and drier.  Break more easily.  Heal more slowly.  Peel and crack. You may also have nerve damage (neuropathy) in your legs and feet, causing decreased feeling in them. This means that you may not notice minor injuries to your feet that could lead to more serious problems. Noticing and addressing any potential problems early is the best way to prevent future foot problems. How to care for your feet Foot hygiene  Wash your feet daily with warm water and mild soap. Do not use hot water. Then, pat your feet and the areas between your toes until they are completely dry. Do not soak your feet as this can dry your skin.  Trim your toenails straight across. Do not dig under them or around the cuticle. File the edges of your nails with an emery board or nail file.  Apply a moisturizing lotion or petroleum jelly to the skin on your feet and to dry, brittle toenails. Use lotion that does not contain alcohol and is unscented. Do not apply lotion between your toes.   Shoes and socks  Wear clean socks or stockings every day. Make sure they are not too tight. Do not wear knee-high stockings since they may decrease blood flow to your legs.  Wear shoes that fit properly and have enough cushioning. Always look in your shoes before you put them on to be sure there are no objects inside.  To break in new shoes, wear them for just a few hours a day. This prevents injuries on your feet. Wounds, scrapes, corns, and calluses  Check your feet daily for blisters, cuts, bruises, sores, and redness. If you cannot see the bottom of your feet, use a mirror or ask someone for help.  Do not cut corns or calluses or try to remove them with medicine.  If you  find a minor scrape, cut, or break in the skin on your feet, keep it and the skin around it clean and dry. You may clean these areas with mild soap and water. Do not clean the area with peroxide, alcohol, or iodine.  If you have a wound, scrape, corn, or callus on your foot, look at it several times a day to make sure it is healing and not infected. Check for: ? Redness, swelling, or pain. ? Fluid or blood. ? Warmth. ? Pus or a bad smell.   General tips  Do not cross your legs. This may decrease blood flow to your feet.  Do not use heating pads or hot water bottles on your feet. They may burn your skin. If you have lost feeling in your feet or legs, you may not know this is happening until it is too late.  Protect your feet from hot and cold by wearing shoes, such as at the beach or on hot pavement.  Schedule a complete foot exam at least once a year (annually) or more often if you have foot problems. Report any cuts, sores, or bruises to your health care provider immediately. Where to find more information  American Diabetes Association: www.diabetes.org  Association of Diabetes Care & Education Specialists: www.diabeteseducator.org Contact a health care provider if:  You have a medical condition that increases your risk of infection and   you have any cuts, sores, or bruises on your feet.  You have an injury that is not healing.  You have redness on your legs or feet.  You feel burning or tingling in your legs or feet.  You have pain or cramps in your legs and feet.  Your legs or feet are numb.  Your feet always feel cold.  You have pain around any toenails. Get help right away if:  You have a wound, scrape, corn, or callus on your foot and: ? You have pain, swelling, or redness that gets worse. ? You have fluid or blood coming from the wound, scrape, corn, or callus. ? Your wound, scrape, corn, or callus feels warm to the touch. ? You have pus or a bad smell coming from  the wound, scrape, corn, or callus. ? You have a fever. ? You have a red line going up your leg. Summary  Check your feet every day for blisters, cuts, bruises, sores, and redness.  Apply a moisturizing lotion or petroleum jelly to the skin on your feet and to dry, brittle toenails.  Wear shoes that fit properly and have enough cushioning.  If you have foot problems, report any cuts, sores, or bruises to your health care provider immediately.  Schedule a complete foot exam at least once a year (annually) or more often if you have foot problems. This information is not intended to replace advice given to you by your health care provider. Make sure you discuss any questions you have with your health care provider. Document Revised: 12/07/2019 Document Reviewed: 12/07/2019 Elsevier Patient Education  2021 Elsevier Inc.  

## 2020-08-01 ENCOUNTER — Telehealth: Payer: Self-pay | Admitting: *Deleted

## 2020-08-01 DIAGNOSIS — E119 Type 2 diabetes mellitus without complications: Secondary | ICD-10-CM

## 2020-08-01 NOTE — Telephone Encounter (Signed)
Patient called and stated that she went to pick up her Trulicity for this month and it was going to cost her $700. Patient cannot afford this. Stated that no one can tell her why the last 2 months it only cost $25.   Patient stated that she was going to call the company for assistance and let us know what they say.

## 2020-08-05 NOTE — Telephone Encounter (Signed)
-   discontinue trulicity due to cost then start on Lantus solastar Pen  8 units SQ daily at bedtime. - check blood sugars and notify provider if readings are above 200

## 2020-08-05 NOTE — Telephone Encounter (Signed)
Patient called and stated that she CANNOT afford the Trulicity. Stated that she called the company and she cannot use the coupon provided. Stated that it is going to cost her $688/month and she cannot do that. Needs changed to something different.  Please Advise.

## 2020-08-05 NOTE — Telephone Encounter (Signed)
LMOM to return call.

## 2020-08-06 NOTE — Telephone Encounter (Signed)
Patient called back stating she refuses to go back on insulin because it makes her gain weight. She has already tried byada, victoza, and trulicity. Ovempa is same as trulicity. [facility] wont allow her to use the coupon on top of her insurance.   She was told if the doctor calls the company directly that she could get a discount. She would like to know if this is true.   Patient can be reached at [phone] To [facility]

## 2020-08-06 NOTE — Telephone Encounter (Signed)
Please call Trulicity company for medication coverage assistance

## 2020-08-09 NOTE — Telephone Encounter (Signed)
Patient tried to use the discount card and still she has to meet her deductible with her insurance. It still is going to cost her $700 and she cannot afford that.   Please Advise.

## 2020-08-09 NOTE — Telephone Encounter (Signed)
Only option will be Lantus insulin if unable to afford Trulicity.

## 2020-08-09 NOTE — Telephone Encounter (Signed)
Initiated Prior Authorization through [employer] for Trulicity and It Stated that member does not require a Prior Authorization.  Key: [id number]  Per Pharmacist Has to meet the Insurance Ducductible first and then it should go down in price.  I printed a Savings card from [employer] and given to Pharmacist to run to see if that would reduce payment, It has to be activated by the patient first.   RxBiN: [id number] PCN: [id number] Group:[id number] Id: [id number]  Patient notified and agreed. Stated that she will pick up the savings card today. Left upfront in drawer for pick up

## 2020-08-12 MED ORDER — SITAGLIPTIN PHOSPHATE 25 MG PO TABS: 25.0000 mg | ORAL_TABLET | Freq: Every day | ORAL | 1 refills | Status: DC

## 2020-08-12 NOTE — Addendum Note (Signed)
Addended by: Nelda Severe A on: 08/12/2020 02:10 PM   Modules accepted: Orders

## 2020-08-12 NOTE — Telephone Encounter (Signed)
Add on Januvia 25 mg tablet one by mouth daily.

## 2020-08-12 NOTE — Telephone Encounter (Signed)
Patient notified and agreed. Medication list updated and Rx sent to Pharmacy.  °

## 2020-08-12 NOTE — Telephone Encounter (Signed)
Patient notified and stated that Lantus is Insulin and she is NOT going to take that.

## 2020-08-14 ENCOUNTER — Other Ambulatory Visit: Payer: Self-pay

## 2020-08-14 ENCOUNTER — Ambulatory Visit
Admission: RE | Admit: 2020-08-14 | Discharge: 2020-08-14 | Disposition: A | Discharge status: Home / Self Care | Payer: 59 | Source: Ambulatory Visit

## 2020-08-14 DIAGNOSIS — Z1231 Encounter for screening mammogram for malignant neoplasm of breast: Secondary | ICD-10-CM

## 2020-08-21 ENCOUNTER — Other Ambulatory Visit: Payer: Self-pay

## 2020-08-21 DIAGNOSIS — E139 Other specified diabetes mellitus without complications: Secondary | ICD-10-CM

## 2020-08-21 NOTE — Telephone Encounter (Signed)
Patient called and stated that she went to pick up the Januvia from the pharmacy and can't afford it.   Patient has to meet the deductible with her insurance before it is cheaper.   Please Advise.

## 2020-08-21 NOTE — Telephone Encounter (Signed)
referral to [facility] send

## 2020-08-21 NOTE — Addendum Note (Signed)
Addended byRicharda Blade C on: 08/21/2020 01:06 PM   Modules accepted: Orders

## 2020-08-21 NOTE — Addendum Note (Signed)
Addended by: Nelda Severe A on: 08/21/2020 12:55 PM   Modules accepted: Orders

## 2020-08-21 NOTE — Telephone Encounter (Signed)
Recommend referral to [facility] for medication assistance program.

## 2020-08-21 NOTE — Telephone Encounter (Signed)
Patient notified and agreed and would like for you to place that referral.

## 2020-08-23 ENCOUNTER — Telehealth: Payer: Self-pay

## 2020-08-23 DIAGNOSIS — Z596 Low income: Secondary | ICD-10-CM

## 2020-08-23 NOTE — Progress Notes (Signed)
[facility])  [place] Team    08/23/2020  Frances Rodriguez 1960/07/09 [id number]  Reason for referral: Medication Assistance with Januvia  Referral source: [facility] coordinator Current insurance: [place]  PMHx includes but not limited to:  Patient has a PMH significant for diabetes and is currently uncontrolled on current regimen of glipizide and metformin. She was previously on Trulicity and switched to Januvia d/t medication cost.   Outreach:  Successful telephone call with Frances Rodriguez.  HIPAA identifiers verified with DOB and home address.   Subjective:  Patient reported that she is unable to afford Januvia or Trulicity and was unsuccessful with Trulicity coupon card that the clinic setup. Per [facility], patient has a [phone] deductible (combined medical + pharmacy expenses) which is why the cost of Januvia was $510 and Trulicity $258.I setup a Januvia discount card as well.I also called and spoke with [facility] and was told the manufacturer coupon card for Trulicity would not pay for coverage until the patient met her deductible - this is not true. Trulicity coupon eligibility has a maximum contribution of $150 for a 30 DS and this is simialr for Januvia as well as other companies for diabetes management (oral/injectable/insulin).Therefore, the remaining cost difference is still too expensive for the patient to afford.  [employer] (biosimilar insulin) does not appear to have a maximum coupon restriction based on their website- although this has not been confirmed with the manufacturer representative yet.   As for community resources, confirmed patient does not quality for Medication Assitance program in [place] and [place] counties. Discussed trying insulin as a final alternative and patient declined [facility] insulin program and [employer]. Patient also expressed being very frustrated with current insurance plan as she  has to pay for medical expenses out-of-pocket as a result of her deductible.   Discussed with patient, as a final effort, about adding Actos to current regimen of glipizide and metformin and patient was open to starting Actos - she is aware of possible dose-related weight gain. GoodRx is cheapest at [employer] for pioglitazone - pt reports being a member of [facility].   Objective: The ASCVD Risk score [name], et al., 2013) failed to calculate for the following reasons:   The valid HDL cholesterol range is 20 to 100 mg/dL  Lab Results  Component Value Date   CREATININE 0.81 07/10/2020   CREATININE 0.9 05/23/2019    Lab Results  Component Value Date   HGBA1C 8.5 (H) 07/10/2020    Lipid Panel     Component Value Date/Time   CHOL 175 07/10/2020 0910   TRIG 90 07/10/2020 0910   HDL 106 07/10/2020 0910   CHOLHDL 1.7 07/10/2020 0910   LDLCALC 52 07/10/2020 0910    BP Readings from Last 3 Encounters:  07/25/20 136/78  07/04/20 118/80  06/07/20 108/68    Allergies  Allergen Reactions  . Percocet [Oxycodone-Acetaminophen] Itching  . Tramadol Itching    Medications Reviewed Today    Reviewed by Criselda Peaches, DPM (Physician) on 07/29/20 at 2040  Med List Status: <None>  Medication Order Taking? Sig Documenting Provider Last Dose Status Informant  acetaminophen (TYLENOL) 500 MG tablet [id number] No Take 500 mg by mouth as needed. [provider] Taking Active   ascorbic acid (VITAMIN C) 500 MG tablet [id number] No Take 500 mg by mouth daily. [provider] Taking Active   Dulaglutide 3 MG/0.5ML SOPN [id number] No Inject 3 mg into the skin  once a week. Ngetich, Dinah C, NP Taking Active   glipiZIDE (GLUCOTROL) 10 MG tablet [id number] No Take 1 tablet (10 mg total) by mouth 2 (two) times daily before a meal. Ngetich, Dinah C, NP Taking Active   lisinopril (ZESTRIL) 5 MG tablet [id number] No Take 1 tablet (5 mg total) by mouth daily. Ngetich, Dinah C, NP  Taking Active   loratadine (CLARITIN) 10 MG tablet [id number] No Take 1 tablet (10 mg total) by mouth daily. Ngetich, Nelda Bucks, NP Taking Active   metFORMIN (GLUCOPHAGE) 1000 MG tablet [id number] No Take 1 tablet (1,000 mg total) by mouth 2 (two) times daily with a meal. Ngetich, Dinah C, NP Taking Active   metoprolol tartrate (LOPRESSOR) 25 MG tablet [id number] No Take 0.5 tablets (12.5 mg total) by mouth 2 (two) times daily. Ngetich, Nelda Bucks, NP Taking Active   OVER THE COUNTER MEDICATION [id number] No Take 1 capsule by mouth 3 (three) times daily. Total Restore. Take medication with meals. 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 Given patient's refusal for insulin, may consider starting Actos since metformin and glipizide are at maximum doses. Most recent LFTs (from this year) are WNL and patient does not have a PMH significant for heart failure. Using GoodRx the cost could potentially be ~$15 at [facility] and [facility] - patient agreed to get medication filled at another pharmacy if her PCP agrees to start Actos. Overall, patient's current insurance plan grossly restricts her from other affordable options.   Of note, samples are available upon request, from the manufacturer, for the following medications: Januvia, Trulicity, Rybelsus, Ozempic, Jardiance, and [place].  Medication Assistance Findings:  Medication assistance needs identified: patient does not qualify  Extra Help:  Not eligible for Extra Help Low Income Subsidy based on reported income and assets  Plan: . Will f/u with PCP re: starting Actos and to send Rx to [facility] since patient is already a member. Will f/u in 3-7 business days.  . If patient does not tolerate Actos well, medication samples are the last option available.  . Since patient continues to refuse insulin, there is no need to f/u with [employer] re: coupon  card clarification.   Thank you for allowing pharmacy to be a part of this patient's care. Kristeen Miss, PharmD Clinical Pharmacist [place] Cell: [phone]

## 2020-08-27 ENCOUNTER — Other Ambulatory Visit: Payer: 59

## 2020-08-27 ENCOUNTER — Other Ambulatory Visit: Payer: 59 | Admitting: Obstetrics and Gynecology

## 2020-08-28 ENCOUNTER — Telehealth: Payer: Self-pay | Admitting: Family

## 2020-08-28 DIAGNOSIS — E119 Type 2 diabetes mellitus without complications: Secondary | ICD-10-CM

## 2020-08-28 NOTE — Telephone Encounter (Signed)
Message received from Asajah Para [facility] Pharmacist states patient's has high deductibles for Januvia and Trulicity even with use of discount coupon.States Pioglitazone will cost $ 15 at [facility] pharmacy since patient is a member.  Please send Pioglitazone 15 mg tablet one by mouth daily to [facility] if okay with patient.

## 2020-08-29 MED ORDER — PIOGLITAZONE HCL 15 MG PO TABS: 15.0000 mg | ORAL_TABLET | Freq: Every day | ORAL | 1 refills | Status: DC

## 2020-08-29 NOTE — Telephone Encounter (Signed)
Patient called to check the status of her message. Patient aware Carilyn Goodpasture has not addressed message yet and I will call her once complete   Patient states she would like for Dinah to prescribe Actos to replace Januvia and Trulicity due to cost.   Patient would like rx sent to [facility]

## 2020-08-29 NOTE — Telephone Encounter (Signed)
Actos 15 mg tablet one by mouth once daily # 90 wirh 1 refill script send to pharmacy

## 2020-08-30 NOTE — Telephone Encounter (Signed)
I would like to confirm the Actos is going to replace the Januvia as Januvia was left on patients medications list when rx for actos was sent

## 2020-08-30 NOTE — Telephone Encounter (Signed)
Correct discontinue Januvia and Trulicity  patient unable to afford copay.

## 2020-09-03 NOTE — Progress Notes (Signed)
Did you mean for this note to go to PCP instead of me ?  Frances Rodriguez  Care Guide, Embedded Care Coordination [facility]  [place], [place] [address] Direct Dial: [phone] [email or web address] Website: [email or web address]

## 2020-09-12 ENCOUNTER — Other Ambulatory Visit: Payer: Self-pay | Admitting: Family

## 2020-09-18 ENCOUNTER — Telehealth: Payer: Self-pay

## 2020-09-18 NOTE — Telephone Encounter (Signed)
Message from [place] at front desk:  Patient canceled upcoming u/s ordered by Loura Pardon. NP. Patient states the reason is that ins not covering and she is in debt financially from having bad ins and cannot keep on making appts. She is trying to get a different ins currently  but not having any luck."  Lupita Leash said she was sending so we could document this info in chart. Routed to TW for FYI.

## 2020-09-29 ENCOUNTER — Other Ambulatory Visit: Payer: Self-pay | Admitting: Family

## 2020-09-29 DIAGNOSIS — E119 Type 2 diabetes mellitus without complications: Secondary | ICD-10-CM

## 2020-10-01 ENCOUNTER — Other Ambulatory Visit: Payer: 59

## 2020-10-01 ENCOUNTER — Other Ambulatory Visit: Payer: 59 | Admitting: Obstetrics and Gynecology

## 2020-10-04 ENCOUNTER — Encounter: Payer: Self-pay | Admitting: Family

## 2020-10-04 ENCOUNTER — Other Ambulatory Visit: Payer: Self-pay

## 2020-10-04 ENCOUNTER — Ambulatory Visit (INDEPENDENT_AMBULATORY_CARE_PROVIDER_SITE_OTHER): Payer: 59 | Admitting: Family

## 2020-10-04 VITALS — BP 126/60 | HR 89 | Temp 97.3°F | Resp 16 | Ht 67.0 in | Wt 228.0 lb

## 2020-10-04 DIAGNOSIS — H9312 Tinnitus, left ear: Secondary | ICD-10-CM | POA: Diagnosis not present

## 2020-10-04 DIAGNOSIS — H9202 Otalgia, left ear: Secondary | ICD-10-CM

## 2020-10-04 NOTE — Patient Instructions (Signed)
-   Take Extra strength Tylenol 500 mg tablet every 8 hrs as needed for left ear pain may alternate with Ibuprofen

## 2020-10-06 NOTE — Progress Notes (Signed)
Provider: Richarda Blade FNP-C  Taiylor Virden, Donalee Citrin, NP  Patient Care Team: Raffael Bugarin, Donalee Citrin, NP as PCP - General (Family Medicine) Jaclyn Shaggy, MD (Endocrinology)  Extended Emergency Contact Information Primary Emergency Contact: Hosie Poisson Home Phone: [phone] Work Phone: [phone] Relation: Niece  Code Status: Full Code  Goals of care: Advanced Directive information Advanced Directives 10/04/2020  Does Patient Have a Medical Advance Directive? No  Would patient like information on creating a medical advance directive? No - Patient declined     Chief Complaint  Patient presents with  . Acute Visit    Complains of left ear concerns.    HPI:  Pt is a 60 y.o. female seen today for an acute visit for evaluation of left ear concerns x 1 days.states has ongoing ringing in the ear but woke up this morning with pain on the outer ear and numbness.she denies any pai, or signs of infection inside the ear.Also denies any fever,chills or recent URI    Past Medical History:  Diagnosis Date  . Diabetes mellitus without complication [facility])    Past Surgical History:  Procedure Laterality Date  . BREAST BIOPSY Left    2013 benign   . [facility]  [facility]  . GASTRIC BYPASS  2005  . TUBAL LIGATION      Allergies  Allergen Reactions  . Percocet [Oxycodone-Acetaminophen] Itching  . Tramadol Itching    Outpatient Encounter Medications as of 10/04/2020  Medication Sig  . acetaminophen (TYLENOL) 500 MG tablet Take 500 mg by mouth as needed.  Marland Kitchen ascorbic acid (VITAMIN C) 500 MG tablet Take 500 mg by mouth daily.  . Dulaglutide 3 MG/0.5ML SOPN Inject 3 mg into the skin once a week.  Marland Kitchen glipiZIDE (GLUCOTROL) 10 MG tablet TAKE 1 TABLET(10 MG) BY MOUTH TWICE DAILY BEFORE A MEAL  . lisinopril (ZESTRIL) 5 MG tablet TAKE 1 TABLET(5 MG) BY MOUTH DAILY  . loratadine (CLARITIN) 10 MG tablet Take 1 tablet (10 mg total) by mouth daily.  . metFORMIN  (GLUCOPHAGE) 1000 MG tablet TAKE 1 TABLET(1000 MG) BY MOUTH TWICE DAILY WITH A MEAL  . metoprolol tartrate (LOPRESSOR) 25 MG tablet Take 0.5 tablets (12.5 mg total) by mouth 2 (two) times daily.  Marland Kitchen OVER THE COUNTER MEDICATION Take 1 capsule by mouth 3 (three) times daily. Total Restore. Take medication with meals.  . pioglitazone (ACTOS) 15 MG tablet Take 1 tablet (15 mg total) by mouth daily.   No facility-administered encounter medications on file as of 10/04/2020.    Review of Systems  Constitutional: Negative for appetite change, chills, fatigue, fever and unexpected weight change.  HENT: Negative for congestion, dental problem, ear discharge, facial swelling, hearing loss, nosebleeds, postnasal drip, rhinorrhea, sinus pressure, sinus pain, sneezing, sore throat, tinnitus and trouble swallowing.        Outer left ear pain   Eyes: Negative for pain, discharge, redness, itching and visual disturbance.  Respiratory: Negative for cough, chest tightness, shortness of breath and wheezing.   Cardiovascular: Negative for chest pain, palpitations and leg swelling.  Gastrointestinal: Negative for abdominal distention, abdominal pain, blood in stool, constipation, diarrhea, nausea and vomiting.  Skin: Negative for color change, pallor, rash and wound.  Neurological: Negative for dizziness, syncope, speech difficulty, weakness, light-headedness, numbness and headaches.    Immunization History  Administered Date(s) Administered  . PFIZER(Purple Top)SARS-COV-2 Vaccination 08/14/2019, 09/05/2019, 04/20/2020  . Pneumococcal Polysaccharide-23 06/16/2019   Pertinent  Health Maintenance Due  Topic Date Due  . FOOT EXAM  Never done  . OPHTHALMOLOGY EXAM  Never done  . INFLUENZA VACCINE  12/30/2020  . HEMOGLOBIN A1C  01/07/2021  . MAMMOGRAM  08/15/2022  . PAP SMEAR-Modifier  07/05/2023  . COLONOSCOPY (Pts 45-42yrs Insurance coverage will need to be confirmed)  07/17/2029   Fall Risk  10/04/2020  07/04/2020 06/11/2020 06/03/2020 06/16/2019  Falls in the past year? 0 0 0 0 0  Number falls in past yr: 0 0 0 0 0  Injury with Fall? 0 0 - 0 0   Functional Status Survey:    Vitals:   10/04/20 1034  BP: 126/60  Pulse: 89  Resp: 16  Temp: (!) 97.3 F (36.3 C)  SpO2: 98%  Weight: 228 lb (103.4 kg)  Height: 5\' 7"  (1.702 m)   Body mass index is 35.71 kg/m. Physical Exam Vitals reviewed.  Constitutional:      General: She is not in acute distress.    Appearance: Normal appearance. She is normal weight. She is not ill-appearing or diaphoretic.  HENT:     Head: Normocephalic.     Right Ear: Tympanic membrane, ear canal and external ear normal. There is no impacted cerumen.     Left Ear: Tympanic membrane, ear canal and external ear normal. There is no impacted cerumen.     Ears:      Nose: Nose normal. No congestion or rhinorrhea.     Mouth/Throat:     Mouth: Mucous membranes are moist.     Pharynx: Oropharynx is clear. No oropharyngeal exudate or posterior oropharyngeal erythema.  Eyes:     General: No scleral icterus.       Right eye: No discharge.        Left eye: No discharge.     Extraocular Movements: Extraocular movements intact.     Conjunctiva/sclera: Conjunctivae normal.     Pupils: Pupils are equal, round, and reactive to light.  Neck:     Vascular: No carotid bruit.  Cardiovascular:     Rate and Rhythm: Normal rate and regular rhythm.     Pulses: Normal pulses.     Heart sounds: Normal heart sounds. No murmur heard. No friction rub. No gallop.   Pulmonary:     Effort: Pulmonary effort is normal. No respiratory distress.     Breath sounds: Normal breath sounds. No wheezing, rhonchi or rales.  Chest:     Chest wall: No tenderness.  Abdominal:     General: Bowel sounds are normal. There is no distension.     Palpations: Abdomen is soft. There is no mass.     Tenderness: There is no abdominal tenderness. There is no right CVA tenderness, left CVA tenderness,  guarding or rebound.  Musculoskeletal:     Cervical back: Normal range of motion. No rigidity or tenderness.  Lymphadenopathy:     Cervical: No cervical adenopathy.  Skin:    General: Skin is warm and dry.     Coloration: Skin is not pale.     Findings: No bruising, erythema, lesion or rash.  Neurological:     Mental Status: She is alert and oriented to person, place, and time.     Cranial Nerves: No cranial nerve deficit.     Motor: No weakness.     Gait: Gait normal.  Psychiatric:        Speech: Speech normal.     Labs reviewed: Recent Labs    07/10/20 0910  NA 137  K  4.6  CL 101  CO2 27  GLUCOSE 176*  BUN 14  CREATININE 0.81  CALCIUM 9.3   Recent Labs    07/10/20 0910  AST 24  ALT 22  BILITOT 0.7  PROT 7.1   Recent Labs    06/03/20 1022  WBC 8.4  NEUTROABS 6,451  HGB 12.4  HCT 36.1  MCV 94.5  PLT 279   Lab Results  Component Value Date   TSH 1.470 06/07/2020   Lab Results  Component Value Date   HGBA1C 8.5 (H) 07/10/2020   Lab Results  Component Value Date   CHOL 175 07/10/2020   HDL 106 07/10/2020   LDLCALC 52 07/10/2020   TRIG 90 07/10/2020   CHOLHDL 1.7 07/10/2020    Significant Diagnostic Results in last 30 days:  No results found.  Assessment/Plan  1. Left ear pain Afebrile. Left tragus tender to palpation.No erythema,swelling or drainage or warm to touch.  - advised to take OTC Tylenol for pain  - monitor for signs of infection will refer to ENT for further evaluation of ringing in the ear. - Ambulatory referral to ENT  2. Tinnitus of left ear TM clear on exam  - Ambulatory referral to ENT  Family/ staff Communication: Reviewed plan of care with patient verbalized understanding.  Labs/tests ordered: None   Next Appointment: As needed if symptoms worsen or fail to improve    Caesar Bookman, NP

## 2020-10-16 ENCOUNTER — Telehealth: Payer: Self-pay

## 2020-10-16 NOTE — Telephone Encounter (Signed)
Thank you & I have noted that insurance info & sent referral over to Dr Narda Bonds, [facility]  [phone]  Sigmund Hazel

## 2020-10-16 NOTE — Telephone Encounter (Signed)
Message left on clinical intake voicemail, patients insurance does not cover referral to ENT, patient is need a referral to [facility]

## 2020-11-18 ENCOUNTER — Other Ambulatory Visit: Payer: Self-pay

## 2020-11-18 ENCOUNTER — Ambulatory Visit (INDEPENDENT_AMBULATORY_CARE_PROVIDER_SITE_OTHER): Payer: 59 | Admitting: Otolaryngology

## 2020-11-18 DIAGNOSIS — M26609 Unspecified temporomandibular joint disorder, unspecified side: Secondary | ICD-10-CM | POA: Diagnosis not present

## 2020-11-18 DIAGNOSIS — H9202 Otalgia, left ear: Secondary | ICD-10-CM | POA: Diagnosis not present

## 2020-11-18 NOTE — Progress Notes (Signed)
HPI: Frances Rodriguez is a 60 y.o. female who presents is referred by her PCP for evaluation of left ear pain that started several weeks ago.  It is doing some better now.  She felt like she had something down in the ear.  She has not noted any hearing problems.  She has had no drainage from the ear.  She was seen by urgent care in [place] as well as her PCP here which noted no abnormality in the ear canal and referred her here..  Past Medical History:  Diagnosis Date   Diabetes mellitus without complication (HCC)    Past Surgical History:  Procedure Laterality Date   BREAST BIOPSY Left    2013 benign    [facility]  [facility] pt   GASTRIC BYPASS  2005   TUBAL LIGATION     Social History   Socioeconomic History   Marital status: Divorced    Spouse name: Not on file   Number of children: Not on file   Years of education: 12   Highest education level: High school graduate  Occupational History   Not on file  Tobacco Use   Smoking status: Never   Smokeless tobacco: Never  Vaping Use   Vaping Use: Never used  Substance and Sexual Activity   Alcohol use: Yes    Alcohol/week: 3.0 standard drinks    Types: 3 Glasses of wine per week    Comment: weekly   Drug use: Not Currently   Sexual activity: Yes    Birth control/protection: Post-menopausal  Other Topics Concern   Not on file  Social History Narrative   Social History      Diet? n/a      Do you drink/eat things with caffeine? coffee      Marital status?         Divorced                            What year were you married? 1980      Do you live in a house, apartment, assisted living, condo, trailer, etc.? house      Is it one or more stories? 2      How many persons live in your home? 2      Do you have any pets in your home? (please list) yes , dog      Highest level of education completed? 12 high school      Current or past profession: supervisor       Do you exercise?           No                             Type & how often?      Advanced Directives      Do you have a living will? No       Do you have a DNR form?      No                             If not, do you want to discuss one? No       Do you have signed POA/HPOA for forms? No       Functional Status      Do you have difficulty bathing  or dressing yourself? No       Do you have difficulty preparing food or eating? No       Do you have difficulty managing your medications? No       Do you have difficulty managing your finances? No       Do you have difficulty affording your medications? No          Social Determinants of Corporate investment banker Strain: Not on file  Food Insecurity: Not on file  Transportation Needs: Not on file  Physical Activity: Not on file  Stress: Not on file  Social Connections: Not on file   Family History  Problem Relation Age of Onset   Breast cancer Mother 45   Breast cancer Sister    Throat cancer Father    Colon cancer Neg Hx    Colon polyps Neg Hx    Esophageal cancer Neg Hx    Stomach cancer Neg Hx    Rectal cancer Neg Hx    Allergies  Allergen Reactions   Percocet [Oxycodone-Acetaminophen] Itching   Tramadol Itching   Prior to Admission medications   Medication Sig Start Date End Date Taking? Authorizing Provider  acetaminophen (TYLENOL) 500 MG tablet Take 500 mg by mouth as needed.    [provider]  ascorbic acid (VITAMIN C) 500 MG tablet Take 500 mg by mouth daily.    [provider]  Dulaglutide 3 MG/0.5ML SOPN Inject 3 mg into the skin once a week. 07/23/20   Ngetich, Dinah C, NP  glipiZIDE (GLUCOTROL) 10 MG tablet TAKE 1 TABLET(10 MG) BY MOUTH TWICE DAILY BEFORE A MEAL 09/30/20   Ngetich, Dinah C, NP  lisinopril (ZESTRIL) 5 MG tablet TAKE 1 TABLET(5 MG) BY MOUTH DAILY 09/12/20   Ngetich, Dinah C, NP  loratadine (CLARITIN) 10 MG tablet Take 1 tablet (10 mg total) by mouth daily. 06/03/20   Ngetich, Dinah C, NP  metFORMIN  (GLUCOPHAGE) 1000 MG tablet TAKE 1 TABLET(1000 MG) BY MOUTH TWICE DAILY WITH A MEAL 09/30/20   Ngetich, Dinah C, NP  metoprolol tartrate (LOPRESSOR) 25 MG tablet Take 0.5 tablets (12.5 mg total) by mouth 2 (two) times daily. 06/03/20   Ngetich, Dinah C, NP  OVER THE COUNTER MEDICATION Take 1 capsule by mouth 3 (three) times daily. Total Restore. Take medication with meals.    [provider]  pioglitazone (ACTOS) 15 MG tablet Take 1 tablet (15 mg total) by mouth daily. 08/29/20   Ngetich, Dinah C, NP     Positive ROS: Otherwise negative  All other systems have been reviewed and were otherwise negative with the exception of those mentioned in the HPI and as above.  Physical Exam: Constitutional: Alert, well-appearing, no acute distress Ears: External ears without lesions or tenderness. Ear canals are clear bilaterally.  Ear canals and TMs are clear bilaterally with no inflammatory problems. Nasal: External nose without lesions. Septum with mild deformity and mild rhinitis. Clear nasal passages Oral: Lips and gums without lesions. Tongue and palate mucosa without lesions. Posterior oropharynx clear.  Tonsils are symmetric and benign in appearance bilaterally. Neck: No palpable adenopathy or masses.  She has pain on palpation of the left TMJ joint compared to the right.  There is no swelling of the parotid gland.  No lymphadenopathy in the neck. Respiratory: Breathing comfortably  Skin: No facial/neck lesions or rash noted.  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  Plan: Recommended a soft diet and  NSAIDs to help with the discomfort over the next week or 2. If problems persist would recommend further evaluation with her dentist and/or oral surgeon.   Narda Bonds, MD   CC:

## 2020-11-20 ENCOUNTER — Other Ambulatory Visit: Payer: Self-pay

## 2020-11-20 ENCOUNTER — Other Ambulatory Visit: Payer: 59

## 2020-11-20 DIAGNOSIS — E119 Type 2 diabetes mellitus without complications: Secondary | ICD-10-CM

## 2020-11-20 DIAGNOSIS — E1159 Type 2 diabetes mellitus with other circulatory complications: Secondary | ICD-10-CM

## 2020-11-20 DIAGNOSIS — I10 Essential (primary) hypertension: Secondary | ICD-10-CM

## 2020-11-21 LAB — CBC WITH DIFFERENTIAL/PLATELET
Absolute Monocytes: 611 cells/uL (ref 200–950)
Basophils Absolute: 38 cells/uL (ref 0–200)
Basophils Relative: 0.6 %
Eosinophils Absolute: 151 cells/uL (ref 15–500)
Eosinophils Relative: 2.4 %
HCT: 31.5 % — ABNORMAL LOW (ref 35.0–45.0)
Hemoglobin: 10.1 g/dL — ABNORMAL LOW (ref 11.7–15.5)
Lymphs Abs: 2539 cells/uL (ref 850–3900)
MCH: 31.4 pg (ref 27.0–33.0)
MCHC: 32.1 g/dL (ref 32.0–36.0)
MCV: 97.8 fL (ref 80.0–100.0)
MPV: 11.3 fL (ref 7.5–12.5)
Monocytes Relative: 9.7 %
Neutro Abs: 2961 cells/uL (ref 1500–7800)
Neutrophils Relative %: 47 %
Platelets: 242 10*3/uL (ref 140–400)
RBC: 3.22 10*6/uL — ABNORMAL LOW (ref 3.80–5.10)
RDW: 11.7 % (ref 11.0–15.0)
Total Lymphocyte: 40.3 %
WBC: 6.3 10*3/uL (ref 3.8–10.8)

## 2020-11-21 LAB — BASIC METABOLIC PANEL WITH GFR
BUN: 15 mg/dL (ref 7–25)
CO2: 24 mmol/L (ref 20–32)
Calcium: 8.9 mg/dL (ref 8.6–10.4)
Chloride: 105 mmol/L (ref 98–110)
Creat: 0.73 mg/dL (ref 0.50–0.99)
GFR, Est African American: 104 mL/min/{1.73_m2} (ref >=60)
GFR, Est Non African American: 90 mL/min/{1.73_m2} (ref >=60)
Glucose, Bld: 116 mg/dL — ABNORMAL HIGH (ref 65–99)
Potassium: 4.3 mmol/L (ref 3.5–5.3)
Sodium: 137 mmol/L (ref 135–146)

## 2020-11-21 LAB — LIPID PANEL
Cholesterol: 174 mg/dL (ref <200)
HDL: 107 mg/dL (ref >=50)
LDL Cholesterol (Calc): 50 mg/dL (calc)
Non-HDL Cholesterol (Calc): 67 mg/dL (calc) (ref <130)
Total CHOL/HDL Ratio: 1.6 (calc) (ref <5.0)
Triglycerides: 89 mg/dL (ref <150)

## 2020-11-21 LAB — HEMOGLOBIN A1C
Hgb A1c MFr Bld: 7.5 % of total Hgb — ABNORMAL HIGH (ref <5.7)
Mean Plasma Glucose: 169 mg/dL
eAG (mmol/L): 9.3 mmol/L

## 2020-11-25 ENCOUNTER — Telehealth: Payer: Self-pay

## 2020-11-25 ENCOUNTER — Encounter: Payer: Self-pay | Admitting: Family

## 2020-11-25 ENCOUNTER — Other Ambulatory Visit: Payer: Self-pay

## 2020-11-25 ENCOUNTER — Telehealth (INDEPENDENT_AMBULATORY_CARE_PROVIDER_SITE_OTHER): Payer: 59 | Admitting: Family

## 2020-11-25 DIAGNOSIS — I10 Essential (primary) hypertension: Secondary | ICD-10-CM | POA: Diagnosis not present

## 2020-11-25 DIAGNOSIS — D649 Anemia, unspecified: Secondary | ICD-10-CM | POA: Diagnosis not present

## 2020-11-25 DIAGNOSIS — G8929 Other chronic pain: Secondary | ICD-10-CM

## 2020-11-25 DIAGNOSIS — G5602 Carpal tunnel syndrome, left upper limb: Secondary | ICD-10-CM | POA: Diagnosis not present

## 2020-11-25 DIAGNOSIS — E119 Type 2 diabetes mellitus without complications: Secondary | ICD-10-CM

## 2020-11-25 DIAGNOSIS — M25562 Pain in left knee: Secondary | ICD-10-CM

## 2020-11-25 MED ORDER — METFORMIN HCL 1000 MG PO TABS
500.0000 mg | ORAL_TABLET | Freq: Two times a day (BID) | ORAL | 1 refills | Status: DC
Start: 2020-11-25 — End: 2021-05-29

## 2020-11-25 NOTE — Progress Notes (Signed)
This service is provided via telemedicine  No vital signs collected/recorded due to the encounter was a telemedicine visit.   Location of patient (ex: home, work): Home  Patient consents to a telephone visit: Yes. Location of the provider (ex: office, home): [employer]. Name of any referring provider: Alpha Chouinard, Nelda Bucks, NP   Names of all persons participating in the telemedicine service and their role in the encounter: Patient, [name], [place], [place], Webb Silversmith, NP.    Time spent on call: 8 minutes spent on the phone with Medical Assistant.      Provider: Marlowe Sax FNP-C   Ellarie Picking, Nelda Bucks, NP  Patient Care Team: Freddie Nghiem, Nelda Bucks, NP as PCP - General (Family Medicine) Seward Grater, MD (Endocrinology)  Extended Emergency Contact Information Primary Emergency Contact: Buford Dresser Home Phone: [phone] Work Phone: [phone] Relation: Niece  Code Status:  Full Code  Goals of care: Advanced Directive information Advanced Directives 11/25/2020  Does Patient Have a Medical Advance Directive? No  Would patient like information on creating a medical advance directive? No - Patient declined     Chief Complaint  Patient presents with  . Medical Management of Chronic Issues    4 month follow up.  Marland Kitchen Health Maintenance    Discuss the need for Foot exam, and Eye exam.  . Immunizations    Discuss the need for Shingrix vaccine, and 2nd Covid Booster.    HPI:  Pt is a 60 y.o. female seen today for 4 months medical management of chronic diseases. Has a medical history of Hypertension,Type 2 DM, bilateral Carpal tunnel syndrome,vitamin D deficiency among others.Her recent lab work reviewed and discussed today.   She states not feeling well today unable to come to the office for a visit.Had diarrhea last night.States metformin giving her diarrhea has been taking Pepto bismol which helps but has to take every day.States stool has been black.color.Has  had no nausea,vomiting,abdominal pain or cramping.  Blood sugar this morning was 116 highest has been in the 150's.No signs of hypo/hyperglycemia.Also on glipizide but no side effects.  She was seen by podiatrist Dr.Adam McDonald on 07/29/2020 no ulcer or peripheral Neuropathy. Also  states recently saw the Ophthalmology was told left eye had cataract just thicker but not enough for surgery.No vision changes or diabetic Retinopathy.No records for review.  Her latest Hgb A1C was 7.5 improved from 8.5    She complains of left knee worsening pain for the past 2 months.states hurts to walk and pain keeps her awake sometimes at night.Has been applying ice for pain relief.   States has numbness and tingling of left arm during the night.states aware she has carpal tunnel.Has a glove that she can wear at night to relief symptoms.Has had surgery on her right hand with improvement of symptoms.  Her Hemoglobin level was 10.1 with normal indices.  Total cholesterol 174,HDL 107,TRG 89,LDL 50 not on statin.   She is due for her shingrix and 2 nd COVID-19 booster vaccine.made aware to get both vaccine at her pharmacy.    Past Medical History:  Diagnosis Date  . Diabetes mellitus without complication [facility])    Past Surgical History:  Procedure Laterality Date  . BREAST BIOPSY Left    2013 benign   . [facility]  [facility]  . GASTRIC BYPASS  2005  . TUBAL LIGATION      Allergies  Allergen Reactions  . Percocet [Oxycodone-Acetaminophen] Itching  .  Tramadol Itching    Allergies as of 11/25/2020       Reactions   Percocet [oxycodone-acetaminophen] Itching   Tramadol Itching        Medication List        Accurate as of November 25, 2020 10:01 AM. If you have any questions, ask your nurse or doctor.          STOP taking these medications    Dulaglutide 3 MG/0.5ML Sopn Stopped by: Sandrea Hughs, NP   lisinopril 5 MG tablet Commonly known as:  ZESTRIL Stopped by: Sandrea Hughs, NP   loratadine 10 MG tablet Commonly known as: CLARITIN Stopped by: Sandrea Hughs, NP   pioglitazone 15 MG tablet Commonly known as: ACTOS Stopped by: Sandrea Hughs, NP       TAKE these medications    acetaminophen 500 MG tablet Commonly known as: TYLENOL Take 500 mg by mouth as needed.   ascorbic acid 500 MG tablet Commonly known as: VITAMIN C Take 500 mg by mouth daily.   glipiZIDE 10 MG tablet Commonly known as: GLUCOTROL TAKE 1 TABLET(10 MG) BY MOUTH TWICE DAILY BEFORE A MEAL   metFORMIN 1000 MG tablet Commonly known as: GLUCOPHAGE TAKE 1 TABLET(1000 MG) BY MOUTH TWICE DAILY WITH A MEAL   metoprolol tartrate 25 MG tablet Commonly known as: LOPRESSOR Take 0.5 tablets (12.5 mg total) by mouth 2 (two) times daily.   OVER THE COUNTER MEDICATION Take 1 capsule by mouth 3 (three) times daily. Total Restore. Take medication with meals.        Review of Systems  Constitutional:  Negative for appetite change, chills, fatigue, fever and unexpected weight change.  HENT:  Negative for congestion, dental problem, ear discharge, ear pain, facial swelling, hearing loss, nosebleeds, postnasal drip, rhinorrhea, sinus pressure, sinus pain, sneezing, sore throat, tinnitus and trouble swallowing.   Eyes:  Negative for pain, discharge, redness, itching and visual disturbance.  Respiratory:  Negative for cough, chest tightness, shortness of breath and wheezing.   Cardiovascular:  Negative for chest pain, palpitations and leg swelling.  Gastrointestinal:  Positive for diarrhea. Negative for abdominal distention, abdominal pain, blood in stool, constipation, nausea and vomiting.       Reports black stool  Endocrine: Negative for cold intolerance, heat intolerance, polydipsia, polyphagia and polyuria.  Genitourinary:  Negative for difficulty urinating, dysuria, flank pain, frequency and urgency.  Musculoskeletal:  Negative for arthralgias,  back pain, gait problem, joint swelling, myalgias, neck pain and neck stiffness.  Skin:  Negative for color change, pallor, rash and wound.  Neurological:  Negative for dizziness, syncope, speech difficulty, weakness, light-headedness and headaches.       Tingling and numbness of left hand at night   Hematological:  Does not bruise/bleed easily.  Psychiatric/Behavioral:  Negative for agitation, behavioral problems, confusion, hallucinations, self-injury, sleep disturbance and suicidal ideas. The patient is not nervous/anxious.    Immunization History  Administered Date(s) Administered  . PFIZER(Purple Top)SARS-COV-2 Vaccination 08/14/2019, 09/05/2019, 04/20/2020  . Pneumococcal Polysaccharide-23 06/16/2019   Pertinent  Health Maintenance Due  Topic Date Due  . FOOT EXAM  Never done  . OPHTHALMOLOGY EXAM  Never done  . INFLUENZA VACCINE  12/30/2020  . HEMOGLOBIN A1C  05/22/2021  . MAMMOGRAM  08/15/2022  . PAP SMEAR-Modifier  07/05/2023  . COLONOSCOPY (Pts 45-11yr Insurance coverage will need to be confirmed)  07/17/2029   Fall Risk  11/25/2020 10/04/2020 07/04/2020 06/11/2020 06/03/2020  Falls in the past year? 0 0 0  0 0  Number falls in past yr: 0 0 0 0 0  Injury with Fall? 0 0 0 - 0  Risk for fall due to : No Fall Risks - - - -  Follow up Falls evaluation completed - - - -   Functional Status Survey:    There were no vitals filed for this visit. There is no height or weight on file to calculate BMI. Physical Exam Constitutional:      General: She is not in acute distress.    Appearance: She is not ill-appearing or diaphoretic.  HENT:     Head: Normocephalic.  Neurological:     Mental Status: She is alert and oriented to person, place, and time.  Psychiatric:        Mood and Affect: Mood normal.        Behavior: Behavior normal.        Thought Content: Thought content normal.        Judgment: Judgment normal.    Labs reviewed: Recent Labs    07/10/20 0910 11/20/20 0925   NA 137 137  K 4.6 4.3  CL 101 105  CO2 27 24  GLUCOSE 176* 116*  BUN 14 15  CREATININE 0.81 0.73  CALCIUM 9.3 8.9   Recent Labs    07/10/20 0910  AST 24  ALT 22  BILITOT 0.7  PROT 7.1   Recent Labs    06/03/20 1022 11/20/20 0925  WBC 8.4 6.3  NEUTROABS 6,451 2,961  HGB 12.4 10.1*  HCT 36.1 31.5*  MCV 94.5 97.8  PLT 279 242   Lab Results  Component Value Date   TSH 1.470 06/07/2020   Lab Results  Component Value Date   HGBA1C 7.5 (H) 11/20/2020   Lab Results  Component Value Date   CHOL 174 11/20/2020   HDL 107 11/20/2020   LDLCALC 50 11/20/2020   TRIG 89 11/20/2020   CHOLHDL 1.6 11/20/2020    Significant Diagnostic Results in last 30 days:  No results found.  Assessment/Plan 1. Anemia, unspecified type Recent hgb 10.1 with reports of black stool.will rule out GI bleed. Kit for Fecal occult blood send by [facility] today.Patient advised to follow instruction for collect stool then mail or return specimen to the office.  - Fecal Globin By Immunochemistry; Future - CBC with Differential/Platelet  2. Type 2 diabetes mellitus without complication, without long-term current use of insulin (HCC) Lab Results  Component Value Date   HGBA1C 7.5 (H) 11/20/2020  Controlled.Having diarrhea with Metformin will reduce dose to 500 mg twice daily. - continue on glipizide  - annual eye and foot exam up to date  - metFORMIN (GLUCOPHAGE) 1000 MG tablet; Take 0.5 tablets (500 mg total) by mouth 2 (two) times daily with a meal.  Dispense: 90 tablet; Refill: 1 - CBC with Differential/Platelet - CMP with eGFR([facility]) - TSH - Hgb A1C - Lipid pane  3. Essential hypertension No home B/p readings for evaluation.continue on metoprolol 12.5 mg tablet twice daily.No side effects reported.  - CBC with Differential/Platelet - CMP with eGFR([facility]) - TSH - Hgb A1C - Lipid pane  4. Left carpal tunnel syndrome Numbness and tingling of hand at night. - encouraged to  wear her night glove as directed. - follow up with hand specialist   6. Chronic pain of left knee Ongoing for two months. Symptoms worsening.will obtain imaging  Advised to get X-ray at [place] at [facility] then will call you with  results.States familiar with [place] Knee Complete 4 Views Left; Future  Family/ staff Communication: Reviewed plan of care with patient verbalized understanding.   Labs/tests ordered:  - CBC with Differential/Platelet - CMP with eGFR([facility]) - TSH - Hgb A1C - Lipid pane - DG Knee Complete 4 Views Left; Future  Next Appointment : 6 months for medical management of chronic issues.Fasting labs prior to visit.   I connected with  Frances Rodriguez on 11/25/20 by a video enabled telemedicine application and verified that I am speaking with the correct person using two identifiers.   I discussed the limitations of evaluation and management by telemedicine. The patient expressed understanding and agreed to proceed.  Spent 25 minutes of  face to face on video with patient    Sandrea Hughs, NP

## 2020-11-25 NOTE — Patient Instructions (Addendum)
-  reduce Metformin from 1000 mg tablet twice daily to 500 mg tablet twice daily   - Please collect stool specimen as directed on the Kit then send to back to office   - Please get X-ray for your left knee at [facility] imaging at [place] then will call you with results

## 2020-11-25 NOTE — Telephone Encounter (Signed)
Ms. daziya, redmond are scheduled for a virtual visit with your provider today.    Just as we do with appointments in the office, we must obtain your consent to participate.  Your consent will be active for this visit and any virtual visit you may have with one of our providers in the next 365 days.    If you have a MyChart account, I can also send a copy of this consent to you electronically.  All virtual visits are billed to your insurance company just like a traditional visit in the office.  As this is a virtual visit, video technology does not allow for your provider to perform a traditional examination.  This may limit your provider's ability to fully assess your condition.  If your provider identifies any concerns that need to be evaluated in person or the need to arrange testing such as labs, EKG, etc, we will make arrangements to do so.    Although advances in technology are sophisticated, we cannot ensure that it will always work on either your end or our end.  If the connection with a video visit is poor, we may have to switch to a telephone visit.  With either a video or telephone visit, we are not always able to ensure that we have a secure connection.   I need to obtain your verbal consent now.   Are you willing to proceed with your visit today?   Nahomi Clingenpeel has provided verbal consent on 11/25/2020 for a virtual visit (video or telephone).   Dicky Doe, CMA 11/25/2020  10:00 AM

## 2021-01-11 ENCOUNTER — Other Ambulatory Visit: Payer: Self-pay | Admitting: Family

## 2021-01-11 DIAGNOSIS — E119 Type 2 diabetes mellitus without complications: Secondary | ICD-10-CM

## 2021-01-20 ENCOUNTER — Encounter: Payer: Self-pay | Admitting: Family

## 2021-01-20 ENCOUNTER — Ambulatory Visit (INDEPENDENT_AMBULATORY_CARE_PROVIDER_SITE_OTHER): Payer: 59 | Admitting: Family

## 2021-01-20 ENCOUNTER — Other Ambulatory Visit: Payer: Self-pay

## 2021-01-20 VITALS — BP 158/70 | HR 108 | Temp 97.3°F | Resp 16 | Ht 67.0 in | Wt 248.8 lb

## 2021-01-20 DIAGNOSIS — E119 Type 2 diabetes mellitus without complications: Secondary | ICD-10-CM

## 2021-01-20 DIAGNOSIS — L609 Nail disorder, unspecified: Secondary | ICD-10-CM

## 2021-01-20 NOTE — Progress Notes (Signed)
Provider: Richarda Blade FNP-C  Legion Discher, Donalee Citrin, NP  Patient Care Team: Shylah Dossantos, Donalee Citrin, NP as PCP - General (Family Medicine) Jaclyn Shaggy, MD (Endocrinology)  Extended Emergency Contact Information Primary Emergency Contact: Hosie Poisson Home Phone: [phone] Work Phone: [phone] Relation: Niece  Code Status:  Full code  Goals of care: Advanced Directive information Advanced Directives 01/20/2021  Does Patient Have a Medical Advance Directive? No  Would patient like information on creating a medical advance directive? No - Patient declined     Chief Complaint  Patient presents with   Acute Visit    Complains of toenail coming off left foot.     HPI:  Pt is a 60 y.o. female seen today for an acute visit for evaluation of left 3rd toenail.states accidentally dropped a bottle on toe 01/18/2021. Toenail has been wiggling and loose.she applied band aid  on toenail to prevent lifting up. She denies any fever,chills,pain or drainage from toenail.   Past Medical History:  Diagnosis Date   Diabetes mellitus without complication [facility])    Past Surgical History:  Procedure Laterality Date   BREAST BIOPSY Left    2013 benign    [facility]  [facility]   GASTRIC BYPASS  2005   TUBAL LIGATION      Allergies  Allergen Reactions   Percocet [Oxycodone-Acetaminophen] Itching   Tramadol Itching    Outpatient Encounter Medications as of 01/20/2021  Medication Sig   acetaminophen (TYLENOL) 500 MG tablet Take 500 mg by mouth as needed.   ascorbic acid (VITAMIN C) 500 MG tablet Take 500 mg by mouth daily.   glipiZIDE (GLUCOTROL) 10 MG tablet TAKE 1 TABLET(10 MG) BY MOUTH TWICE DAILY BEFORE A MEAL   metFORMIN (GLUCOPHAGE) 1000 MG tablet Take 0.5 tablets (500 mg total) by mouth 2 (two) times daily with a meal.   metoprolol tartrate (LOPRESSOR) 25 MG tablet Take 0.5 tablets (12.5 mg total) by mouth 2 (two) times daily.   [DISCONTINUED]  OVER THE COUNTER MEDICATION Take 1 capsule by mouth 3 (three) times daily. Total Restore. Take medication with meals.   No facility-administered encounter medications on file as of 01/20/2021.    Review of Systems  Constitutional:  Negative for appetite change, chills, fatigue, fever and unexpected weight change.  Respiratory:  Negative for cough, chest tightness, shortness of breath and wheezing.   Cardiovascular:  Negative for chest pain, palpitations and leg swelling.  Musculoskeletal:  Negative for arthralgias, gait problem, joint swelling and myalgias.  Skin:  Negative for color change, pallor and rash.       Loose toenail   Neurological:  Negative for dizziness, weakness, numbness and headaches.   Immunization History  Administered Date(s) Administered   PFIZER(Purple Top)SARS-COV-2 Vaccination 08/14/2019, 09/05/2019, 04/20/2020   Pneumococcal Polysaccharide-23 06/16/2019   Pertinent  Health Maintenance Due  Topic Date Due   FOOT EXAM  Never done   OPHTHALMOLOGY EXAM  Never done   INFLUENZA VACCINE  12/30/2020   HEMOGLOBIN A1C  05/22/2021   URINE MICROALBUMIN  07/04/2021   MAMMOGRAM  08/15/2022   PAP SMEAR-Modifier  07/05/2023   COLONOSCOPY (Pts 45-80yrs Insurance coverage will need to be confirmed)  07/17/2029   Fall Risk  01/20/2021 11/25/2020 10/04/2020 07/04/2020 06/11/2020  Falls in the past year? 0 0 0 0 0  Number falls in past yr: 0 0 0 0 0  Injury with Fall? 0 0 0 0 -  Risk for fall  due to : No Fall Risks No Fall Risks - - -  Follow up Falls evaluation completed Falls evaluation completed - - -   Functional Status Survey:    Vitals:   01/20/21 1054  BP: (!) 158/70  Pulse: (!) 108  Resp: 16  Temp: (!) 97.3 F (36.3 C)  SpO2: 97%  Weight: 248 lb 12.8 oz (112.9 kg)  Height: 5\' 7"  (1.702 m)   Body mass index is 38.97 kg/m. Physical Exam Vitals reviewed.  Constitutional:      General: She is not in acute distress.    Appearance: Normal appearance. She is  overweight. She is not ill-appearing or diaphoretic.  Cardiovascular:     Rate and Rhythm: Normal rate and regular rhythm.     Pulses: Normal pulses.     Heart sounds: Normal heart sounds. No murmur heard.   No friction rub. No gallop.  Pulmonary:     Effort: Pulmonary effort is normal. No respiratory distress.     Breath sounds: Normal breath sounds. No wheezing, rhonchi or rales.  Chest:     Chest wall: No tenderness.  Musculoskeletal:        General: No swelling or tenderness. Normal range of motion.     Right lower leg: No edema.     Left lower leg: No edema.       Feet:  Feet:     Right foot:     Skin integrity: Skin integrity normal.     Left foot:     Skin integrity: Skin integrity normal.  Skin:    General: Skin is warm and dry.     Coloration: Skin is not pale.     Findings: No bruising, erythema, lesion or rash.  Neurological:     Mental Status: She is alert and oriented to person, place, and time.     Cranial Nerves: No cranial nerve deficit.     Sensory: No sensory deficit.     Motor: No weakness.     Coordination: Coordination normal.     Gait: Gait normal.  Psychiatric:        Speech: Speech normal.    Labs reviewed: Recent Labs    07/10/20 0910 11/20/20 0925  NA 137 137  K 4.6 4.3  CL 101 105  CO2 27 24  GLUCOSE 176* 116*  BUN 14 15  CREATININE 0.81 0.73  CALCIUM 9.3 8.9   Recent Labs    07/10/20 0910  AST 24  ALT 22  BILITOT 0.7  PROT 7.1   Recent Labs    06/03/20 1022 11/20/20 0925  WBC 8.4 6.3  NEUTROABS 6,451 2,961  HGB 12.4 10.1*  HCT 36.1 31.5*  MCV 94.5 97.8  PLT 279 242   Lab Results  Component Value Date   TSH 1.470 06/07/2020   Lab Results  Component Value Date   HGBA1C 7.5 (H) 11/20/2020   Lab Results  Component Value Date   CHOL 174 11/20/2020   HDL 107 11/20/2020   LDLCALC 50 11/20/2020   TRIG 89 11/20/2020   CHOLHDL 1.6 11/20/2020    Significant Diagnostic Results in last 30 days:  No results  found.  Assessment/Plan  1. Loose toenail Status post trauma to left 3rd toenail after she accidentally dropped a bottle on the toe.On exam Left 3rd toe nail loose but intact on base.No tenderness,erythema or drainage noted.  - Band aid applied for extra protection of toe nail.Advised to change daily. Notify provider for any signs or  symptoms of infection.  - will order urgent referral to Podiatrist for further evaluation of toenail.  - Ambulatory referral to Podiatry  2. Type 2 diabetes mellitus without complication, without long-term current use of insulin (HCC) Lab Results  Component Value Date   HGBA1C 7.5 (H) 11/20/2020  CBG well controlled 133 this morning. - continue on glipizide and Metformin  - Ambulatory referral to Podiatry for left 3rd toenail evaluation.   Family/ staff Communication: Reviewed plan of care with patient verbalized understanding.   Labs/tests ordered: None   Next Appointment: Has appointment in place already 06/03/2021   Caesar Bookman, NP

## 2021-01-20 NOTE — Patient Instructions (Addendum)
-   Urgent referral ordered for Podiatrist specialist office will call with appointment - take over the counter Tylenol as needed for pain

## 2021-01-22 ENCOUNTER — Other Ambulatory Visit: Payer: Self-pay

## 2021-01-22 ENCOUNTER — Ambulatory Visit: Payer: Self-pay | Admitting: Podiatry

## 2021-02-07 ENCOUNTER — Ambulatory Visit: Payer: Self-pay | Admitting: Family

## 2021-02-12 ENCOUNTER — Other Ambulatory Visit: Payer: Self-pay | Admitting: Family

## 2021-02-13 LAB — FECAL GLOBIN BY IMMUNOCHEMISTRY
FECAL GLOBIN RESULT:: NOT DETECTED
MICRO NUMBER:: 12373179
SPECIMEN QUALITY:: ADEQUATE

## 2021-04-07 ENCOUNTER — Other Ambulatory Visit: Payer: Self-pay

## 2021-04-07 ENCOUNTER — Ambulatory Visit (HOSPITAL_BASED_OUTPATIENT_CLINIC_OR_DEPARTMENT_OTHER)
Admission: RE | Admit: 2021-04-07 | Discharge: 2021-04-07 | Disposition: A | Discharge status: Home / Self Care | Payer: 59 | Source: Ambulatory Visit | Attending: Family | Admitting: Family

## 2021-04-07 DIAGNOSIS — M25562 Pain in left knee: Secondary | ICD-10-CM | POA: Diagnosis present

## 2021-04-07 DIAGNOSIS — G8929 Other chronic pain: Secondary | ICD-10-CM | POA: Insufficient documentation

## 2021-04-08 ENCOUNTER — Other Ambulatory Visit: Payer: Self-pay | Admitting: Family

## 2021-04-08 DIAGNOSIS — M25562 Pain in left knee: Secondary | ICD-10-CM

## 2021-04-08 DIAGNOSIS — S70359A Superficial foreign body, unspecified thigh, initial encounter: Secondary | ICD-10-CM

## 2021-04-08 DIAGNOSIS — G8929 Other chronic pain: Secondary | ICD-10-CM

## 2021-05-28 ENCOUNTER — Other Ambulatory Visit: Payer: 59

## 2021-05-29 ENCOUNTER — Telehealth: Payer: Self-pay | Admitting: *Deleted

## 2021-05-29 DIAGNOSIS — R002 Palpitations: Secondary | ICD-10-CM

## 2021-05-29 DIAGNOSIS — E119 Type 2 diabetes mellitus without complications: Secondary | ICD-10-CM

## 2021-05-29 MED ORDER — METFORMIN HCL 1000 MG PO TABS: 500.0000 mg | ORAL_TABLET | Freq: Two times a day (BID) | ORAL | 0 refills | Status: AC

## 2021-05-29 MED ORDER — GLIPIZIDE 10 MG PO TABS: ORAL_TABLET | ORAL | 0 refills | Status: AC

## 2021-05-29 MED ORDER — METOPROLOL TARTRATE 25 MG PO TABS: 12.5000 mg | ORAL_TABLET | Freq: Two times a day (BID) | ORAL | 0 refills | Status: DC

## 2021-05-29 NOTE — Telephone Encounter (Signed)
May refill all medication for 30 days.

## 2021-05-29 NOTE — Telephone Encounter (Signed)
Patient called and stated that she has Moved to [facility] and has not been able to find a PCP yet. Her insurance will be changing 06/01/21.   Patient is needing her medications refill for 30 days until she can find a new PCP.   Pended Rx's and sent to [facility] for approval.

## 2021-06-03 ENCOUNTER — Ambulatory Visit: Payer: 59 | Admitting: Family

## 2021-07-10 ENCOUNTER — Ambulatory Visit: Payer: 59 | Admitting: Family

## 2021-07-29 ENCOUNTER — Ambulatory Visit: Payer: 59 | Admitting: Podiatry

## 2022-11-08 ENCOUNTER — Ambulatory Visit
Admission: EM | Admit: 2022-11-08 | Discharge: 2022-11-08 | Disposition: A | Discharge status: Home / Self Care | Payer: 59 | Attending: Family Medicine | Admitting: Family Medicine

## 2022-11-08 DIAGNOSIS — S0501XA Injury of conjunctiva and corneal abrasion without foreign body, right eye, initial encounter: Secondary | ICD-10-CM

## 2022-11-08 MED ORDER — GENTAMICIN SULFATE 0.3 % OP SOLN: 2.0000 [drp] | Freq: Three times a day (TID) | OPHTHALMIC | 0 refills | Status: AC

## 2022-11-08 NOTE — ED Provider Notes (Signed)
[name]    CSN: [id number] Arrival date & time: 11/08/22  0800      History   Chief Complaint Chief Complaint  Patient presents with   Foreign Body in Eye    HPI Frances Rodriguez is a 62 y.o. female.    Foreign Body in Eye   Here for irritation/tearing of her right eye. Last night had trouble getting her contact out of her right eye. She has worked and worked to try to get it out, and now she is not sure if the contact is still in there or not.  NKDA    Past Medical History:  Diagnosis Date   Diabetes mellitus without complication [facility])     Patient Active Problem List   Diagnosis Date Noted   Anemia 11/25/2020   Bilateral hand numbness 06/15/2019   Type 2 diabetes mellitus without complication, without long-term current use of insulin (HCC) 06/09/2019   Insomnia 06/09/2019   Bilateral carpal tunnel syndrome 06/09/2019   Vitamin D deficiency 02/08/2019   Chest pain 09/07/2018    Past Surgical History:  Procedure Laterality Date   BREAST BIOPSY Left    2013 benign    COLONOSCOPY  15 years ago    [facility]   GASTRIC BYPASS  2005   TUBAL LIGATION      OB History     Gravida  5   Para  5   Term      Preterm      AB      Living         SAB      IAB      Ectopic      Multiple      Live Births               Home Medications    Prior to Admission medications   Medication Sig Start Date End Date Taking? Authorizing Provider  gentamicin (GARAMYCIN) 0.3 % ophthalmic solution Place 2 drops into the right eye 3 (three) times daily for 5 days. 11/08/22 11/13/22 Yes Annalucia Laino, Janace Aris, MD  glipiZIDE (GLUCOTROL) 10 MG tablet Take one tablet by mouth twice daily before a meal. 05/29/21  Yes Ngetich, Dinah C, NP  VICTOZA 18 MG/3ML SOPN Inject into the skin. 10/23/22  Yes [provider]  acetaminophen (TYLENOL) 500 MG tablet Take 500 mg by mouth as needed.    [provider]  ascorbic acid  (VITAMIN C) 500 MG tablet Take 500 mg by mouth daily.    [provider]  metFORMIN (GLUCOPHAGE) 1000 MG tablet Take 0.5 tablets (500 mg total) by mouth 2 (two) times daily with a meal. 05/29/21   Ngetich, Donalee Citrin, NP    Family History Family History  Problem Relation Age of Onset   Breast cancer Mother 5   Breast cancer Sister    Throat cancer Father    Colon cancer Neg Hx    Colon polyps Neg Hx    Esophageal cancer Neg Hx    Stomach cancer Neg Hx    Rectal cancer Neg Hx     Social History Social History   Tobacco Use   Smoking status: Never   Smokeless tobacco: Never  Vaping Use   Vaping Use: Never used  Substance Use Topics   Alcohol use: Yes    Alcohol/week: 3.0 standard drinks of alcohol    Types: 3 Glasses of wine per week    Comment: weekly  Drug use: Not Currently     Allergies   Percocet [oxycodone-acetaminophen] and Tramadol   Review of Systems Review of Systems   Physical Exam Triage Vital Signs ED Triage Vitals [11/08/22 0814]  Enc Vitals Group     BP (!) 158/74     Pulse Rate 79     Resp 20     Temp (!) 97.4 F (36.3 C)     Temp Source Oral     SpO2 96 %     Weight      Height      Head Circumference      Peak Flow      Pain Score 3     Pain Loc      Pain Edu?      Excl. in GC?    No data found.  Updated Vital Signs BP (!) 158/74 (BP Location: Left Arm)   Pulse 79   Temp (!) 97.4 F (36.3 C) (Oral)   Resp 20   SpO2 96%   Visual Acuity Right Eye Distance:   Left Eye Distance:   Bilateral Distance:    Right Eye Near:   Left Eye Near:    Bilateral Near:     Physical Exam Vitals reviewed.  Constitutional:      General: She is not in acute distress.    Appearance: She is not ill-appearing, toxic-appearing or diaphoretic.  HENT:     Mouth/Throat:     Mouth: Mucous membranes are moist.  Eyes:     Extraocular Movements: Extraocular movements intact.     Pupils: Pupils are equal, round, and reactive to light.      Comments: There is mild injection of the right eye.  With the bare eye and light, I cannot see a foreign body.  Skin:    Coloration: Skin is not pale.  Neurological:     Mental Status: She is alert and oriented to person, place, and time.  Psychiatric:        Behavior: Behavior normal.      UC Treatments / Results  Labs (all labs ordered are listed, but only abnormal results are displayed) Labs Reviewed - No data to display  EKG   Radiology No results found.  Procedures Procedures (including critical care time)  Medications Ordered in UC Medications - No data to display  Initial Impression / Assessment and Plan / UC Course  I have reviewed the triage vital signs and the nursing notes.  Pertinent labs & imaging results that were available during my care of the patient were reviewed by me and considered in my medical decision making (see chart for details).        After consent was given, tetracaine drops were applied to her right eye.  Further inspection while rolling up the upper lid on for a Q-tip did not show any foreign body that I could see.  Fluorescein staining is done and with the UV light there is possibly a punctate area of uptake over the lateral part of the iris.  Eye is rinsed with saline  Antibiotic drops are sent in with gentamicin.  If she worsens anyway or she does not improve, I have discussed with her to either go to the emergency room for more detailed exam or to call her eye doctor Final Clinical Impressions(s) / UC Diagnoses   Final diagnoses:  Abrasion of right cornea, initial encounter     Discharge Instructions      Put gentamicin eyedrops in the  affected eye(s) 3 times daily for 5 days.  If you do not improve or if you worsen, please proceed to the emergency room and/or call     ED Prescriptions     Medication Sig Dispense Auth. Provider   gentamicin (GARAMYCIN) 0.3 % ophthalmic solution Place 2 drops into the right eye 3  (three) times daily for 5 days. 5 mL Zenia Resides, MD      PDMP not reviewed this encounter.   Zenia Resides, MD 11/08/22 [phone]

## 2022-11-08 NOTE — ED Triage Notes (Signed)
Pt reports she has a contact stuck in her right eye x 1 day. Pt has blurred vision and eye pain.

## 2022-11-08 NOTE — Discharge Instructions (Signed)
Put gentamicin eyedrops in the affected eye(s) 3 times daily for 5 days.  If you do not improve or if you worsen, please proceed to the emergency room and/or call

## 2023-01-14 IMAGING — DX DG KNEE COMPLETE 4+V*L*
4 series · 4 of 4 positions shown · non-contrast
Comparison: None.

CLINICAL DATA: Left-sided knee pain

EXAM:
LEFT KNEE - COMPLETE 4+ VIEW

[knee ap]
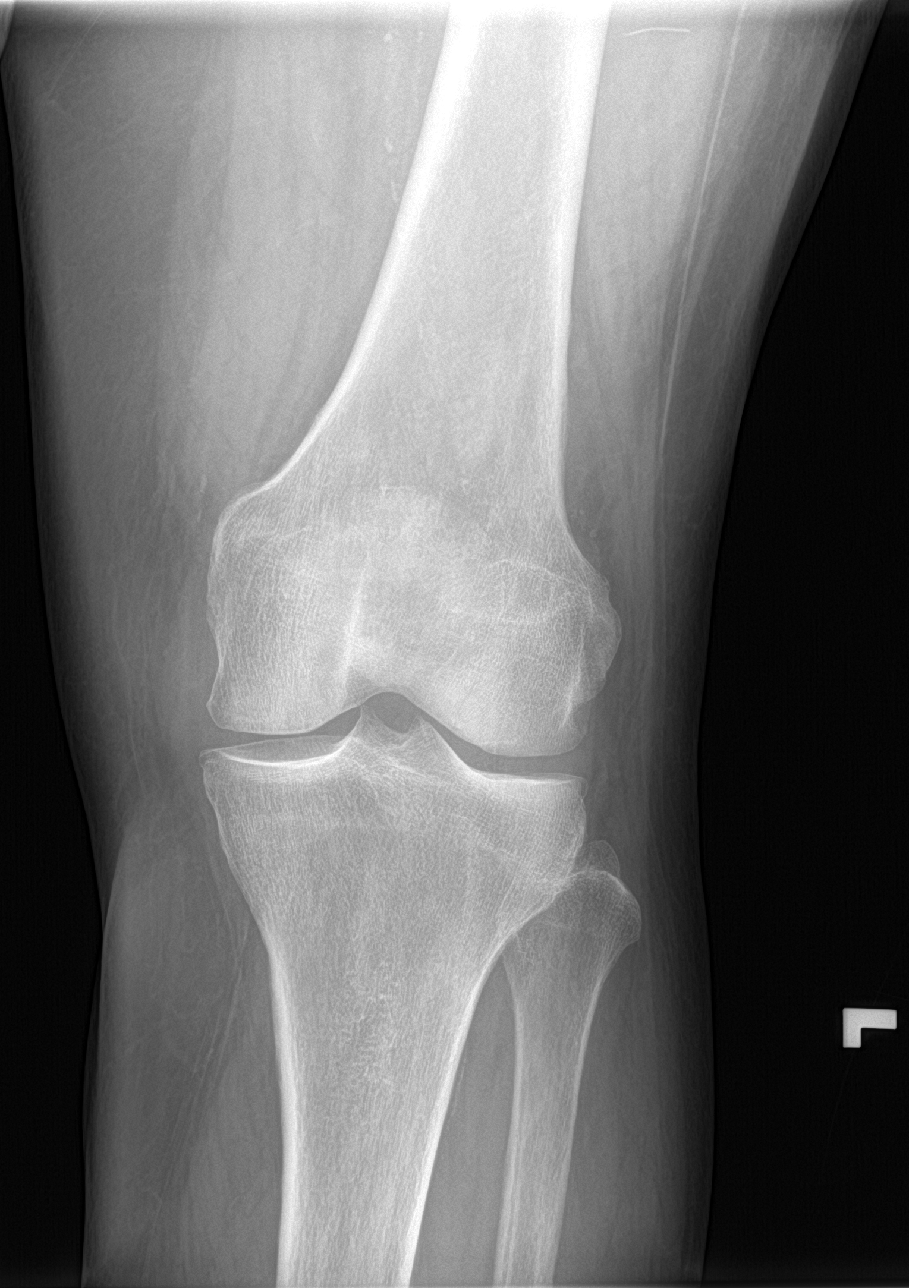

[knee lat]
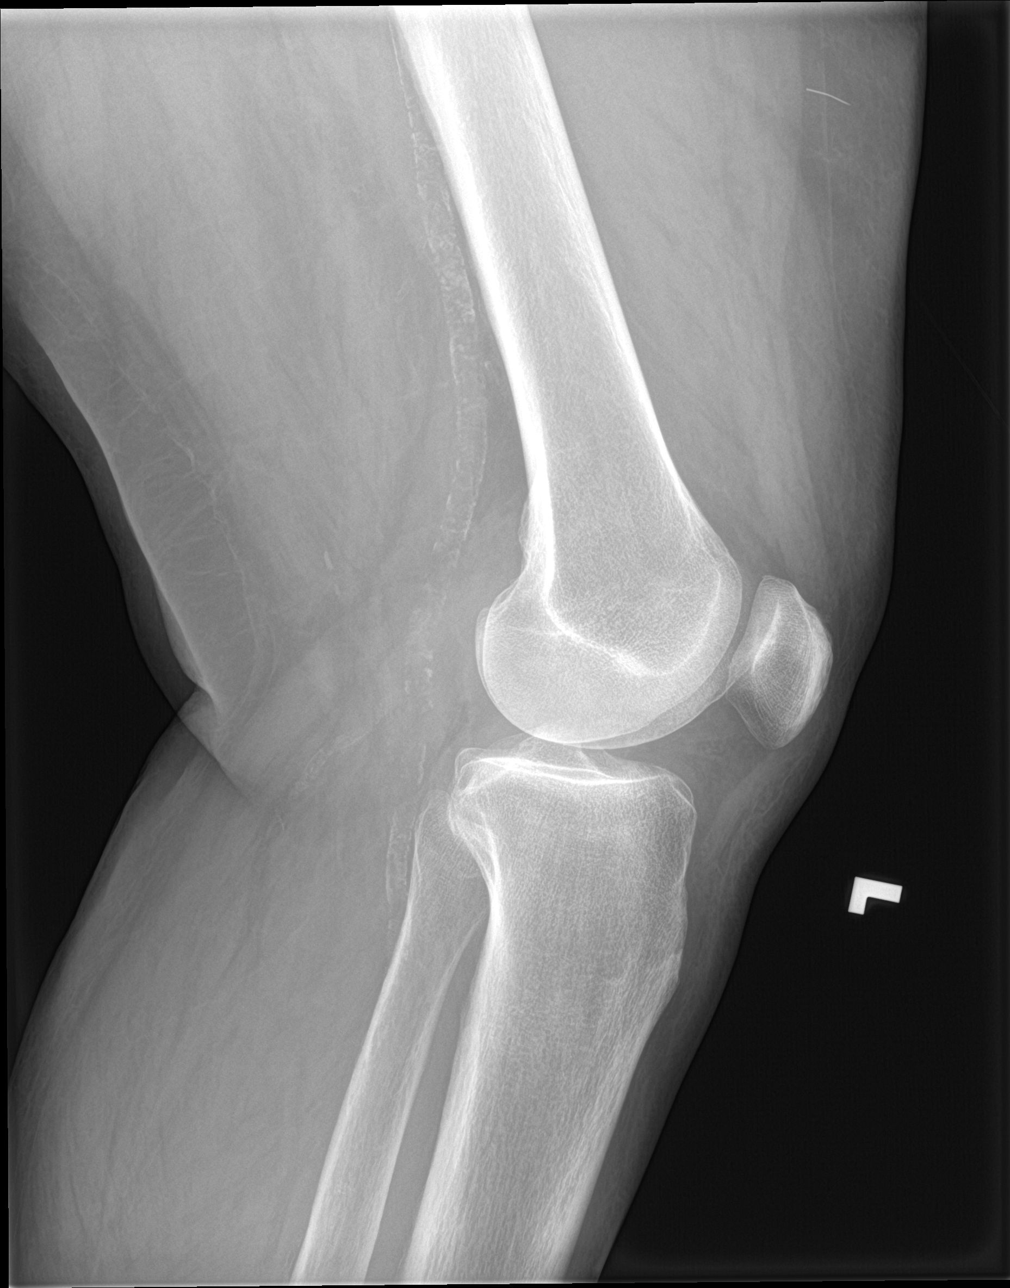

[knee obl (1 of 2)]
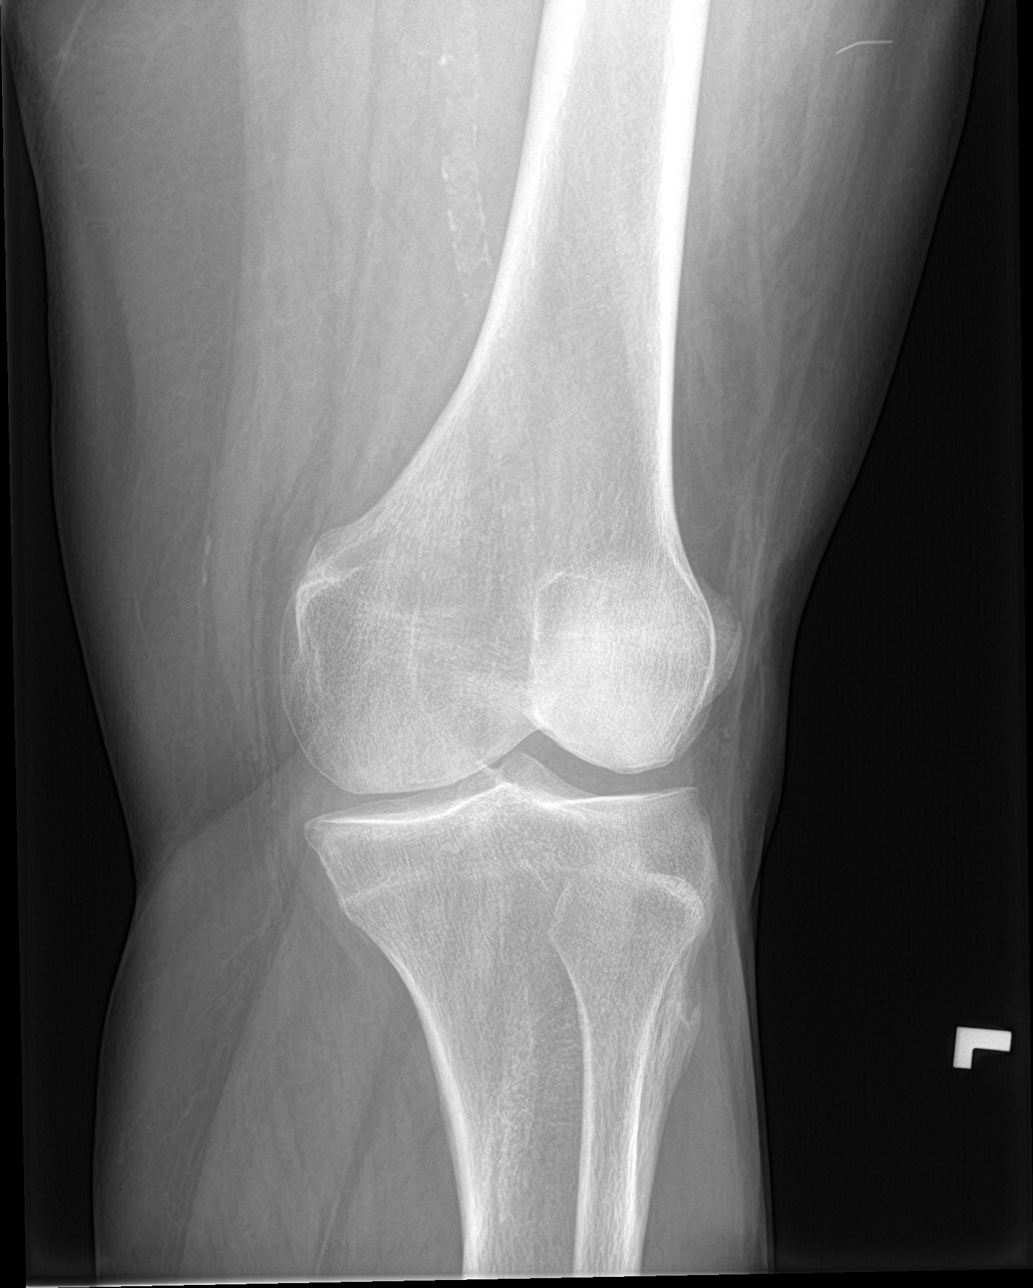

[knee obl (2 of 2)]
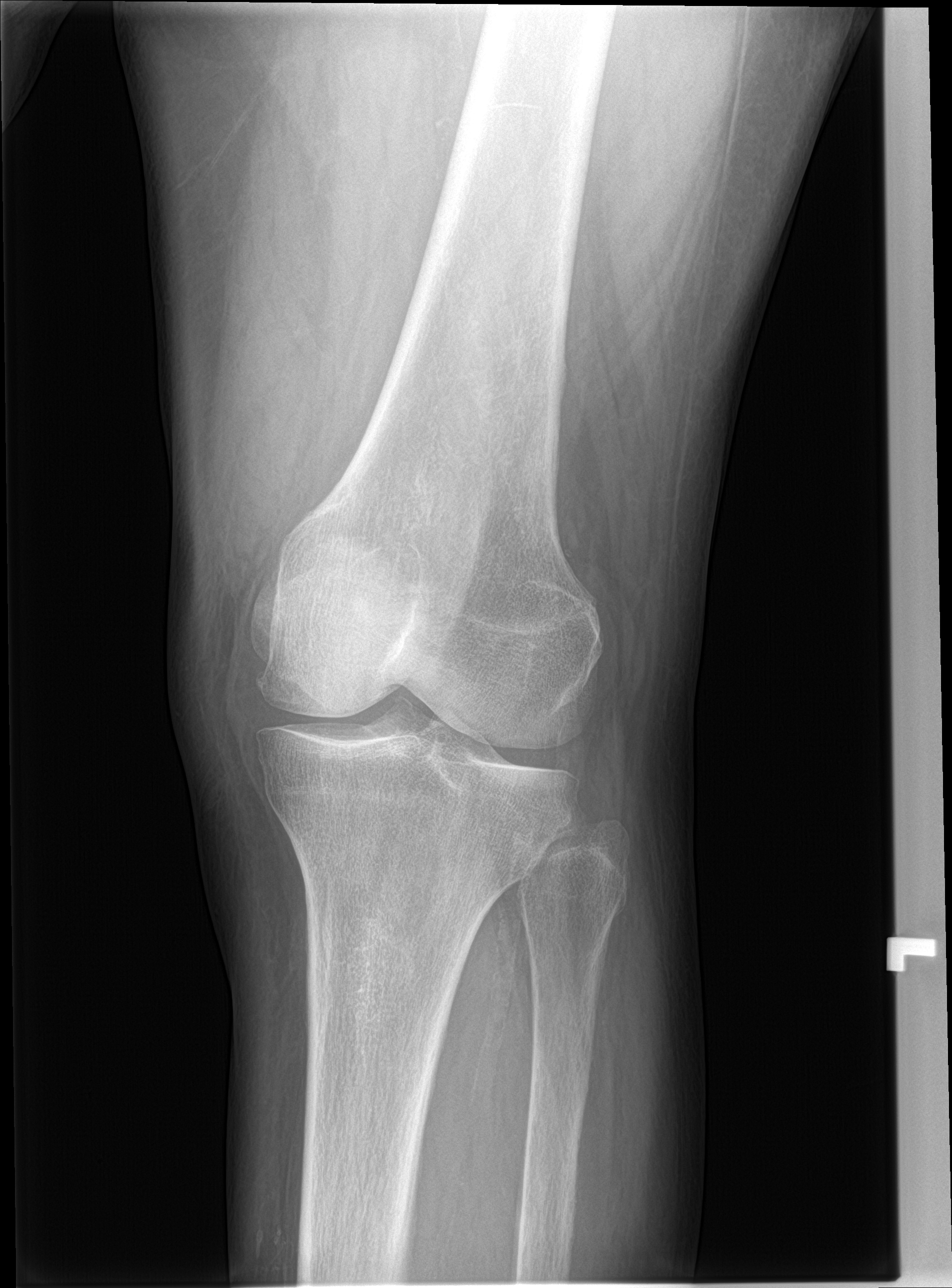

[4 of 4 positions shown; findings below may reference images not displayed]

FINDINGS: No fracture or malalignment. Mild medial joint space degenerative
change. No sizable effusion. Vascular calcifications. 13 mm linear
suspected foreign body within the distal thigh soft tissues
IMPRESSION: 1. Mild degenerative change
2. Suspected 13 mm linear foreign body in the soft tissues of the
distal thigh
